# Patient Record
Sex: Male | Born: 1950
Health system: Southern US, Community
[De-identification: ages and names within clinical notes are randomized; demographics above are authoritative.]

---

## 2016-03-13 DIAGNOSIS — I10 Essential (primary) hypertension: Secondary | ICD-10-CM | POA: Diagnosis not present

## 2016-03-22 DIAGNOSIS — I10 Essential (primary) hypertension: Secondary | ICD-10-CM | POA: Diagnosis not present

## 2016-04-02 DIAGNOSIS — G4700 Insomnia, unspecified: Secondary | ICD-10-CM | POA: Diagnosis not present

## 2016-04-04 DIAGNOSIS — B351 Tinea unguium: Secondary | ICD-10-CM | POA: Diagnosis not present

## 2016-04-04 DIAGNOSIS — M79675 Pain in left toe(s): Secondary | ICD-10-CM | POA: Diagnosis not present

## 2016-04-04 DIAGNOSIS — E1140 Type 2 diabetes mellitus with diabetic neuropathy, unspecified: Secondary | ICD-10-CM | POA: Diagnosis not present

## 2016-04-11 DIAGNOSIS — I82423 Acute embolism and thrombosis of iliac vein, bilateral: Secondary | ICD-10-CM | POA: Diagnosis not present

## 2016-04-11 DIAGNOSIS — R600 Localized edema: Secondary | ICD-10-CM | POA: Diagnosis not present

## 2016-04-11 DIAGNOSIS — I10 Essential (primary) hypertension: Secondary | ICD-10-CM | POA: Diagnosis not present

## 2016-04-11 DIAGNOSIS — I69320 Aphasia following cerebral infarction: Secondary | ICD-10-CM | POA: Diagnosis not present

## 2016-04-19 DIAGNOSIS — I10 Essential (primary) hypertension: Secondary | ICD-10-CM | POA: Diagnosis not present

## 2016-04-30 DIAGNOSIS — G4700 Insomnia, unspecified: Secondary | ICD-10-CM | POA: Diagnosis not present

## 2016-05-17 DIAGNOSIS — I10 Essential (primary) hypertension: Secondary | ICD-10-CM | POA: Diagnosis not present

## 2016-05-23 DIAGNOSIS — R600 Localized edema: Secondary | ICD-10-CM | POA: Diagnosis not present

## 2016-05-23 DIAGNOSIS — I69320 Aphasia following cerebral infarction: Secondary | ICD-10-CM | POA: Diagnosis not present

## 2016-05-23 DIAGNOSIS — G8191 Hemiplegia, unspecified affecting right dominant side: Secondary | ICD-10-CM | POA: Diagnosis not present

## 2016-05-23 DIAGNOSIS — I10 Essential (primary) hypertension: Secondary | ICD-10-CM | POA: Diagnosis not present

## 2016-06-22 DIAGNOSIS — M1990 Unspecified osteoarthritis, unspecified site: Secondary | ICD-10-CM | POA: Diagnosis not present

## 2016-06-22 DIAGNOSIS — E785 Hyperlipidemia, unspecified: Secondary | ICD-10-CM | POA: Diagnosis not present

## 2016-06-22 DIAGNOSIS — E1140 Type 2 diabetes mellitus with diabetic neuropathy, unspecified: Secondary | ICD-10-CM | POA: Diagnosis not present

## 2016-06-27 DIAGNOSIS — I890 Lymphedema, not elsewhere classified: Secondary | ICD-10-CM | POA: Diagnosis not present

## 2016-06-27 DIAGNOSIS — E1140 Type 2 diabetes mellitus with diabetic neuropathy, unspecified: Secondary | ICD-10-CM | POA: Diagnosis not present

## 2016-06-27 DIAGNOSIS — H54415A Blindness right eye category 5, normal vision left eye: Secondary | ICD-10-CM | POA: Diagnosis not present

## 2016-06-27 DIAGNOSIS — Z993 Dependence on wheelchair: Secondary | ICD-10-CM | POA: Diagnosis not present

## 2016-06-27 DIAGNOSIS — I69390 Apraxia following cerebral infarction: Secondary | ICD-10-CM | POA: Diagnosis not present

## 2016-06-27 DIAGNOSIS — I10 Essential (primary) hypertension: Secondary | ICD-10-CM | POA: Diagnosis not present

## 2016-06-27 DIAGNOSIS — I69351 Hemiplegia and hemiparesis following cerebral infarction affecting right dominant side: Secondary | ICD-10-CM | POA: Diagnosis not present

## 2016-06-27 DIAGNOSIS — I69320 Aphasia following cerebral infarction: Secondary | ICD-10-CM | POA: Diagnosis not present

## 2016-06-28 DIAGNOSIS — E1140 Type 2 diabetes mellitus with diabetic neuropathy, unspecified: Secondary | ICD-10-CM | POA: Diagnosis not present

## 2016-06-28 DIAGNOSIS — I69390 Apraxia following cerebral infarction: Secondary | ICD-10-CM | POA: Diagnosis not present

## 2016-06-28 DIAGNOSIS — I890 Lymphedema, not elsewhere classified: Secondary | ICD-10-CM | POA: Diagnosis not present

## 2016-06-28 DIAGNOSIS — I10 Essential (primary) hypertension: Secondary | ICD-10-CM | POA: Diagnosis not present

## 2016-06-28 DIAGNOSIS — I69320 Aphasia following cerebral infarction: Secondary | ICD-10-CM | POA: Diagnosis not present

## 2016-06-28 DIAGNOSIS — I69351 Hemiplegia and hemiparesis following cerebral infarction affecting right dominant side: Secondary | ICD-10-CM | POA: Diagnosis not present

## 2016-06-29 DIAGNOSIS — I10 Essential (primary) hypertension: Secondary | ICD-10-CM | POA: Diagnosis not present

## 2016-06-29 DIAGNOSIS — L989 Disorder of the skin and subcutaneous tissue, unspecified: Secondary | ICD-10-CM | POA: Diagnosis not present

## 2016-06-29 DIAGNOSIS — I69359 Hemiplegia and hemiparesis following cerebral infarction affecting unspecified side: Secondary | ICD-10-CM | POA: Diagnosis not present

## 2016-06-29 DIAGNOSIS — F5101 Primary insomnia: Secondary | ICD-10-CM | POA: Diagnosis not present

## 2016-06-29 DIAGNOSIS — I639 Cerebral infarction, unspecified: Secondary | ICD-10-CM | POA: Diagnosis not present

## 2016-06-29 DIAGNOSIS — E782 Mixed hyperlipidemia: Secondary | ICD-10-CM | POA: Diagnosis not present

## 2016-06-29 DIAGNOSIS — I890 Lymphedema, not elsewhere classified: Secondary | ICD-10-CM | POA: Diagnosis not present

## 2016-06-29 DIAGNOSIS — K5900 Constipation, unspecified: Secondary | ICD-10-CM | POA: Diagnosis not present

## 2016-07-03 DIAGNOSIS — I890 Lymphedema, not elsewhere classified: Secondary | ICD-10-CM | POA: Diagnosis not present

## 2016-07-03 DIAGNOSIS — E1140 Type 2 diabetes mellitus with diabetic neuropathy, unspecified: Secondary | ICD-10-CM | POA: Diagnosis not present

## 2016-07-03 DIAGNOSIS — I69351 Hemiplegia and hemiparesis following cerebral infarction affecting right dominant side: Secondary | ICD-10-CM | POA: Diagnosis not present

## 2016-07-03 DIAGNOSIS — I69320 Aphasia following cerebral infarction: Secondary | ICD-10-CM | POA: Diagnosis not present

## 2016-07-03 DIAGNOSIS — I69390 Apraxia following cerebral infarction: Secondary | ICD-10-CM | POA: Diagnosis not present

## 2016-07-03 DIAGNOSIS — I10 Essential (primary) hypertension: Secondary | ICD-10-CM | POA: Diagnosis not present

## 2016-07-04 DIAGNOSIS — I890 Lymphedema, not elsewhere classified: Secondary | ICD-10-CM | POA: Diagnosis not present

## 2016-07-04 DIAGNOSIS — I69320 Aphasia following cerebral infarction: Secondary | ICD-10-CM | POA: Diagnosis not present

## 2016-07-04 DIAGNOSIS — I10 Essential (primary) hypertension: Secondary | ICD-10-CM | POA: Diagnosis not present

## 2016-07-04 DIAGNOSIS — E1140 Type 2 diabetes mellitus with diabetic neuropathy, unspecified: Secondary | ICD-10-CM | POA: Diagnosis not present

## 2016-07-04 DIAGNOSIS — I69390 Apraxia following cerebral infarction: Secondary | ICD-10-CM | POA: Diagnosis not present

## 2016-07-04 DIAGNOSIS — I69351 Hemiplegia and hemiparesis following cerebral infarction affecting right dominant side: Secondary | ICD-10-CM | POA: Diagnosis not present

## 2016-07-05 DIAGNOSIS — I69320 Aphasia following cerebral infarction: Secondary | ICD-10-CM | POA: Diagnosis not present

## 2016-07-05 DIAGNOSIS — I890 Lymphedema, not elsewhere classified: Secondary | ICD-10-CM | POA: Diagnosis not present

## 2016-07-05 DIAGNOSIS — E1140 Type 2 diabetes mellitus with diabetic neuropathy, unspecified: Secondary | ICD-10-CM | POA: Diagnosis not present

## 2016-07-05 DIAGNOSIS — I10 Essential (primary) hypertension: Secondary | ICD-10-CM | POA: Diagnosis not present

## 2016-07-05 DIAGNOSIS — I69351 Hemiplegia and hemiparesis following cerebral infarction affecting right dominant side: Secondary | ICD-10-CM | POA: Diagnosis not present

## 2016-07-05 DIAGNOSIS — I69390 Apraxia following cerebral infarction: Secondary | ICD-10-CM | POA: Diagnosis not present

## 2016-07-09 DIAGNOSIS — I10 Essential (primary) hypertension: Secondary | ICD-10-CM | POA: Diagnosis not present

## 2016-07-09 DIAGNOSIS — I69390 Apraxia following cerebral infarction: Secondary | ICD-10-CM | POA: Diagnosis not present

## 2016-07-09 DIAGNOSIS — I890 Lymphedema, not elsewhere classified: Secondary | ICD-10-CM | POA: Diagnosis not present

## 2016-07-09 DIAGNOSIS — I69351 Hemiplegia and hemiparesis following cerebral infarction affecting right dominant side: Secondary | ICD-10-CM | POA: Diagnosis not present

## 2016-07-09 DIAGNOSIS — E1140 Type 2 diabetes mellitus with diabetic neuropathy, unspecified: Secondary | ICD-10-CM | POA: Diagnosis not present

## 2016-07-09 DIAGNOSIS — I69320 Aphasia following cerebral infarction: Secondary | ICD-10-CM | POA: Diagnosis not present

## 2016-07-10 DIAGNOSIS — E1140 Type 2 diabetes mellitus with diabetic neuropathy, unspecified: Secondary | ICD-10-CM | POA: Diagnosis not present

## 2016-07-10 DIAGNOSIS — I69351 Hemiplegia and hemiparesis following cerebral infarction affecting right dominant side: Secondary | ICD-10-CM | POA: Diagnosis not present

## 2016-07-10 DIAGNOSIS — I69390 Apraxia following cerebral infarction: Secondary | ICD-10-CM | POA: Diagnosis not present

## 2016-07-10 DIAGNOSIS — I69320 Aphasia following cerebral infarction: Secondary | ICD-10-CM | POA: Diagnosis not present

## 2016-07-10 DIAGNOSIS — I10 Essential (primary) hypertension: Secondary | ICD-10-CM | POA: Diagnosis not present

## 2016-07-10 DIAGNOSIS — I890 Lymphedema, not elsewhere classified: Secondary | ICD-10-CM | POA: Diagnosis not present

## 2016-07-11 DIAGNOSIS — I69351 Hemiplegia and hemiparesis following cerebral infarction affecting right dominant side: Secondary | ICD-10-CM | POA: Diagnosis not present

## 2016-07-11 DIAGNOSIS — I890 Lymphedema, not elsewhere classified: Secondary | ICD-10-CM | POA: Diagnosis not present

## 2016-07-11 DIAGNOSIS — I69320 Aphasia following cerebral infarction: Secondary | ICD-10-CM | POA: Diagnosis not present

## 2016-07-11 DIAGNOSIS — I69390 Apraxia following cerebral infarction: Secondary | ICD-10-CM | POA: Diagnosis not present

## 2016-07-11 DIAGNOSIS — E1140 Type 2 diabetes mellitus with diabetic neuropathy, unspecified: Secondary | ICD-10-CM | POA: Diagnosis not present

## 2016-07-11 DIAGNOSIS — I10 Essential (primary) hypertension: Secondary | ICD-10-CM | POA: Diagnosis not present

## 2016-07-12 DIAGNOSIS — I69390 Apraxia following cerebral infarction: Secondary | ICD-10-CM | POA: Diagnosis not present

## 2016-07-12 DIAGNOSIS — I10 Essential (primary) hypertension: Secondary | ICD-10-CM | POA: Diagnosis not present

## 2016-07-12 DIAGNOSIS — E1140 Type 2 diabetes mellitus with diabetic neuropathy, unspecified: Secondary | ICD-10-CM | POA: Diagnosis not present

## 2016-07-12 DIAGNOSIS — I69351 Hemiplegia and hemiparesis following cerebral infarction affecting right dominant side: Secondary | ICD-10-CM | POA: Diagnosis not present

## 2016-07-12 DIAGNOSIS — I890 Lymphedema, not elsewhere classified: Secondary | ICD-10-CM | POA: Diagnosis not present

## 2016-07-12 DIAGNOSIS — I69320 Aphasia following cerebral infarction: Secondary | ICD-10-CM | POA: Diagnosis not present

## 2016-07-13 DIAGNOSIS — I69390 Apraxia following cerebral infarction: Secondary | ICD-10-CM | POA: Diagnosis not present

## 2016-07-13 DIAGNOSIS — I10 Essential (primary) hypertension: Secondary | ICD-10-CM | POA: Diagnosis not present

## 2016-07-13 DIAGNOSIS — I890 Lymphedema, not elsewhere classified: Secondary | ICD-10-CM | POA: Diagnosis not present

## 2016-07-13 DIAGNOSIS — I69351 Hemiplegia and hemiparesis following cerebral infarction affecting right dominant side: Secondary | ICD-10-CM | POA: Diagnosis not present

## 2016-07-13 DIAGNOSIS — I69320 Aphasia following cerebral infarction: Secondary | ICD-10-CM | POA: Diagnosis not present

## 2016-07-13 DIAGNOSIS — E1140 Type 2 diabetes mellitus with diabetic neuropathy, unspecified: Secondary | ICD-10-CM | POA: Diagnosis not present

## 2016-07-16 DIAGNOSIS — I69351 Hemiplegia and hemiparesis following cerebral infarction affecting right dominant side: Secondary | ICD-10-CM | POA: Diagnosis not present

## 2016-07-16 DIAGNOSIS — I69390 Apraxia following cerebral infarction: Secondary | ICD-10-CM | POA: Diagnosis not present

## 2016-07-16 DIAGNOSIS — I890 Lymphedema, not elsewhere classified: Secondary | ICD-10-CM | POA: Diagnosis not present

## 2016-07-16 DIAGNOSIS — I10 Essential (primary) hypertension: Secondary | ICD-10-CM | POA: Diagnosis not present

## 2016-07-16 DIAGNOSIS — I69320 Aphasia following cerebral infarction: Secondary | ICD-10-CM | POA: Diagnosis not present

## 2016-07-16 DIAGNOSIS — E1140 Type 2 diabetes mellitus with diabetic neuropathy, unspecified: Secondary | ICD-10-CM | POA: Diagnosis not present

## 2016-07-17 DIAGNOSIS — I69320 Aphasia following cerebral infarction: Secondary | ICD-10-CM | POA: Diagnosis not present

## 2016-07-17 DIAGNOSIS — I69390 Apraxia following cerebral infarction: Secondary | ICD-10-CM | POA: Diagnosis not present

## 2016-07-17 DIAGNOSIS — E1140 Type 2 diabetes mellitus with diabetic neuropathy, unspecified: Secondary | ICD-10-CM | POA: Diagnosis not present

## 2016-07-17 DIAGNOSIS — I69351 Hemiplegia and hemiparesis following cerebral infarction affecting right dominant side: Secondary | ICD-10-CM | POA: Diagnosis not present

## 2016-07-17 DIAGNOSIS — I890 Lymphedema, not elsewhere classified: Secondary | ICD-10-CM | POA: Diagnosis not present

## 2016-07-17 DIAGNOSIS — I10 Essential (primary) hypertension: Secondary | ICD-10-CM | POA: Diagnosis not present

## 2016-07-18 DIAGNOSIS — E1140 Type 2 diabetes mellitus with diabetic neuropathy, unspecified: Secondary | ICD-10-CM | POA: Diagnosis not present

## 2016-07-18 DIAGNOSIS — I69320 Aphasia following cerebral infarction: Secondary | ICD-10-CM | POA: Diagnosis not present

## 2016-07-18 DIAGNOSIS — I10 Essential (primary) hypertension: Secondary | ICD-10-CM | POA: Diagnosis not present

## 2016-07-18 DIAGNOSIS — I890 Lymphedema, not elsewhere classified: Secondary | ICD-10-CM | POA: Diagnosis not present

## 2016-07-18 DIAGNOSIS — I69390 Apraxia following cerebral infarction: Secondary | ICD-10-CM | POA: Diagnosis not present

## 2016-07-18 DIAGNOSIS — I69351 Hemiplegia and hemiparesis following cerebral infarction affecting right dominant side: Secondary | ICD-10-CM | POA: Diagnosis not present

## 2016-07-19 DIAGNOSIS — I69320 Aphasia following cerebral infarction: Secondary | ICD-10-CM | POA: Diagnosis not present

## 2016-07-19 DIAGNOSIS — I890 Lymphedema, not elsewhere classified: Secondary | ICD-10-CM | POA: Diagnosis not present

## 2016-07-19 DIAGNOSIS — I10 Essential (primary) hypertension: Secondary | ICD-10-CM | POA: Diagnosis not present

## 2016-07-19 DIAGNOSIS — I69390 Apraxia following cerebral infarction: Secondary | ICD-10-CM | POA: Diagnosis not present

## 2016-07-19 DIAGNOSIS — E1140 Type 2 diabetes mellitus with diabetic neuropathy, unspecified: Secondary | ICD-10-CM | POA: Diagnosis not present

## 2016-07-19 DIAGNOSIS — I69351 Hemiplegia and hemiparesis following cerebral infarction affecting right dominant side: Secondary | ICD-10-CM | POA: Diagnosis not present

## 2016-07-23 DIAGNOSIS — I69390 Apraxia following cerebral infarction: Secondary | ICD-10-CM | POA: Diagnosis not present

## 2016-07-23 DIAGNOSIS — I890 Lymphedema, not elsewhere classified: Secondary | ICD-10-CM | POA: Diagnosis not present

## 2016-07-23 DIAGNOSIS — I10 Essential (primary) hypertension: Secondary | ICD-10-CM | POA: Diagnosis not present

## 2016-07-23 DIAGNOSIS — I69351 Hemiplegia and hemiparesis following cerebral infarction affecting right dominant side: Secondary | ICD-10-CM | POA: Diagnosis not present

## 2016-07-23 DIAGNOSIS — E1140 Type 2 diabetes mellitus with diabetic neuropathy, unspecified: Secondary | ICD-10-CM | POA: Diagnosis not present

## 2016-07-23 DIAGNOSIS — I69320 Aphasia following cerebral infarction: Secondary | ICD-10-CM | POA: Diagnosis not present

## 2016-07-24 DIAGNOSIS — I69351 Hemiplegia and hemiparesis following cerebral infarction affecting right dominant side: Secondary | ICD-10-CM | POA: Diagnosis not present

## 2016-07-24 DIAGNOSIS — E1140 Type 2 diabetes mellitus with diabetic neuropathy, unspecified: Secondary | ICD-10-CM | POA: Diagnosis not present

## 2016-07-24 DIAGNOSIS — I69320 Aphasia following cerebral infarction: Secondary | ICD-10-CM | POA: Diagnosis not present

## 2016-07-24 DIAGNOSIS — I10 Essential (primary) hypertension: Secondary | ICD-10-CM | POA: Diagnosis not present

## 2016-07-24 DIAGNOSIS — I890 Lymphedema, not elsewhere classified: Secondary | ICD-10-CM | POA: Diagnosis not present

## 2016-07-24 DIAGNOSIS — I69390 Apraxia following cerebral infarction: Secondary | ICD-10-CM | POA: Diagnosis not present

## 2016-07-26 DIAGNOSIS — I890 Lymphedema, not elsewhere classified: Secondary | ICD-10-CM | POA: Diagnosis not present

## 2016-07-26 DIAGNOSIS — E1140 Type 2 diabetes mellitus with diabetic neuropathy, unspecified: Secondary | ICD-10-CM | POA: Diagnosis not present

## 2016-07-26 DIAGNOSIS — I69320 Aphasia following cerebral infarction: Secondary | ICD-10-CM | POA: Diagnosis not present

## 2016-07-26 DIAGNOSIS — I69351 Hemiplegia and hemiparesis following cerebral infarction affecting right dominant side: Secondary | ICD-10-CM | POA: Diagnosis not present

## 2016-07-26 DIAGNOSIS — I10 Essential (primary) hypertension: Secondary | ICD-10-CM | POA: Diagnosis not present

## 2016-07-26 DIAGNOSIS — I69390 Apraxia following cerebral infarction: Secondary | ICD-10-CM | POA: Diagnosis not present

## 2016-07-27 DIAGNOSIS — I69351 Hemiplegia and hemiparesis following cerebral infarction affecting right dominant side: Secondary | ICD-10-CM | POA: Diagnosis not present

## 2016-07-27 DIAGNOSIS — I69320 Aphasia following cerebral infarction: Secondary | ICD-10-CM | POA: Diagnosis not present

## 2016-07-27 DIAGNOSIS — I10 Essential (primary) hypertension: Secondary | ICD-10-CM | POA: Diagnosis not present

## 2016-07-27 DIAGNOSIS — E1140 Type 2 diabetes mellitus with diabetic neuropathy, unspecified: Secondary | ICD-10-CM | POA: Diagnosis not present

## 2016-07-27 DIAGNOSIS — I69390 Apraxia following cerebral infarction: Secondary | ICD-10-CM | POA: Diagnosis not present

## 2016-07-27 DIAGNOSIS — I890 Lymphedema, not elsewhere classified: Secondary | ICD-10-CM | POA: Diagnosis not present

## 2016-07-30 DIAGNOSIS — I890 Lymphedema, not elsewhere classified: Secondary | ICD-10-CM | POA: Diagnosis not present

## 2016-07-30 DIAGNOSIS — I69320 Aphasia following cerebral infarction: Secondary | ICD-10-CM | POA: Diagnosis not present

## 2016-07-30 DIAGNOSIS — E1140 Type 2 diabetes mellitus with diabetic neuropathy, unspecified: Secondary | ICD-10-CM | POA: Diagnosis not present

## 2016-07-30 DIAGNOSIS — I10 Essential (primary) hypertension: Secondary | ICD-10-CM | POA: Diagnosis not present

## 2016-07-30 DIAGNOSIS — I69351 Hemiplegia and hemiparesis following cerebral infarction affecting right dominant side: Secondary | ICD-10-CM | POA: Diagnosis not present

## 2016-07-30 DIAGNOSIS — I69390 Apraxia following cerebral infarction: Secondary | ICD-10-CM | POA: Diagnosis not present

## 2016-07-31 DIAGNOSIS — I10 Essential (primary) hypertension: Secondary | ICD-10-CM | POA: Diagnosis not present

## 2016-07-31 DIAGNOSIS — I69390 Apraxia following cerebral infarction: Secondary | ICD-10-CM | POA: Diagnosis not present

## 2016-07-31 DIAGNOSIS — I69320 Aphasia following cerebral infarction: Secondary | ICD-10-CM | POA: Diagnosis not present

## 2016-07-31 DIAGNOSIS — I890 Lymphedema, not elsewhere classified: Secondary | ICD-10-CM | POA: Diagnosis not present

## 2016-07-31 DIAGNOSIS — E1140 Type 2 diabetes mellitus with diabetic neuropathy, unspecified: Secondary | ICD-10-CM | POA: Diagnosis not present

## 2016-07-31 DIAGNOSIS — I69351 Hemiplegia and hemiparesis following cerebral infarction affecting right dominant side: Secondary | ICD-10-CM | POA: Diagnosis not present

## 2016-08-01 DIAGNOSIS — I69320 Aphasia following cerebral infarction: Secondary | ICD-10-CM | POA: Diagnosis not present

## 2016-08-01 DIAGNOSIS — I69390 Apraxia following cerebral infarction: Secondary | ICD-10-CM | POA: Diagnosis not present

## 2016-08-01 DIAGNOSIS — I890 Lymphedema, not elsewhere classified: Secondary | ICD-10-CM | POA: Diagnosis not present

## 2016-08-01 DIAGNOSIS — I69351 Hemiplegia and hemiparesis following cerebral infarction affecting right dominant side: Secondary | ICD-10-CM | POA: Diagnosis not present

## 2016-08-01 DIAGNOSIS — I10 Essential (primary) hypertension: Secondary | ICD-10-CM | POA: Diagnosis not present

## 2016-08-01 DIAGNOSIS — E1140 Type 2 diabetes mellitus with diabetic neuropathy, unspecified: Secondary | ICD-10-CM | POA: Diagnosis not present

## 2016-08-02 DIAGNOSIS — E1140 Type 2 diabetes mellitus with diabetic neuropathy, unspecified: Secondary | ICD-10-CM | POA: Diagnosis not present

## 2016-08-02 DIAGNOSIS — I890 Lymphedema, not elsewhere classified: Secondary | ICD-10-CM | POA: Diagnosis not present

## 2016-08-02 DIAGNOSIS — I10 Essential (primary) hypertension: Secondary | ICD-10-CM | POA: Diagnosis not present

## 2016-08-02 DIAGNOSIS — I69390 Apraxia following cerebral infarction: Secondary | ICD-10-CM | POA: Diagnosis not present

## 2016-08-02 DIAGNOSIS — I69320 Aphasia following cerebral infarction: Secondary | ICD-10-CM | POA: Diagnosis not present

## 2016-08-02 DIAGNOSIS — I69351 Hemiplegia and hemiparesis following cerebral infarction affecting right dominant side: Secondary | ICD-10-CM | POA: Diagnosis not present

## 2016-08-03 DIAGNOSIS — I69351 Hemiplegia and hemiparesis following cerebral infarction affecting right dominant side: Secondary | ICD-10-CM | POA: Diagnosis not present

## 2016-08-03 DIAGNOSIS — I69320 Aphasia following cerebral infarction: Secondary | ICD-10-CM | POA: Diagnosis not present

## 2016-08-03 DIAGNOSIS — I890 Lymphedema, not elsewhere classified: Secondary | ICD-10-CM | POA: Diagnosis not present

## 2016-08-03 DIAGNOSIS — I10 Essential (primary) hypertension: Secondary | ICD-10-CM | POA: Diagnosis not present

## 2016-08-03 DIAGNOSIS — I69390 Apraxia following cerebral infarction: Secondary | ICD-10-CM | POA: Diagnosis not present

## 2016-08-03 DIAGNOSIS — E1140 Type 2 diabetes mellitus with diabetic neuropathy, unspecified: Secondary | ICD-10-CM | POA: Diagnosis not present

## 2016-08-07 DIAGNOSIS — I69320 Aphasia following cerebral infarction: Secondary | ICD-10-CM | POA: Diagnosis not present

## 2016-08-07 DIAGNOSIS — I69351 Hemiplegia and hemiparesis following cerebral infarction affecting right dominant side: Secondary | ICD-10-CM | POA: Diagnosis not present

## 2016-08-07 DIAGNOSIS — I890 Lymphedema, not elsewhere classified: Secondary | ICD-10-CM | POA: Diagnosis not present

## 2016-08-07 DIAGNOSIS — I69390 Apraxia following cerebral infarction: Secondary | ICD-10-CM | POA: Diagnosis not present

## 2016-08-07 DIAGNOSIS — E1140 Type 2 diabetes mellitus with diabetic neuropathy, unspecified: Secondary | ICD-10-CM | POA: Diagnosis not present

## 2016-08-07 DIAGNOSIS — I10 Essential (primary) hypertension: Secondary | ICD-10-CM | POA: Diagnosis not present

## 2016-08-08 DIAGNOSIS — I69320 Aphasia following cerebral infarction: Secondary | ICD-10-CM | POA: Diagnosis not present

## 2016-08-08 DIAGNOSIS — I10 Essential (primary) hypertension: Secondary | ICD-10-CM | POA: Diagnosis not present

## 2016-08-08 DIAGNOSIS — I69390 Apraxia following cerebral infarction: Secondary | ICD-10-CM | POA: Diagnosis not present

## 2016-08-08 DIAGNOSIS — E1140 Type 2 diabetes mellitus with diabetic neuropathy, unspecified: Secondary | ICD-10-CM | POA: Diagnosis not present

## 2016-08-08 DIAGNOSIS — I69351 Hemiplegia and hemiparesis following cerebral infarction affecting right dominant side: Secondary | ICD-10-CM | POA: Diagnosis not present

## 2016-08-08 DIAGNOSIS — I890 Lymphedema, not elsewhere classified: Secondary | ICD-10-CM | POA: Diagnosis not present

## 2016-08-09 DIAGNOSIS — I69390 Apraxia following cerebral infarction: Secondary | ICD-10-CM | POA: Diagnosis not present

## 2016-08-09 DIAGNOSIS — I890 Lymphedema, not elsewhere classified: Secondary | ICD-10-CM | POA: Diagnosis not present

## 2016-08-09 DIAGNOSIS — I69351 Hemiplegia and hemiparesis following cerebral infarction affecting right dominant side: Secondary | ICD-10-CM | POA: Diagnosis not present

## 2016-08-09 DIAGNOSIS — I10 Essential (primary) hypertension: Secondary | ICD-10-CM | POA: Diagnosis not present

## 2016-08-09 DIAGNOSIS — E1140 Type 2 diabetes mellitus with diabetic neuropathy, unspecified: Secondary | ICD-10-CM | POA: Diagnosis not present

## 2016-08-09 DIAGNOSIS — I69320 Aphasia following cerebral infarction: Secondary | ICD-10-CM | POA: Diagnosis not present

## 2016-08-13 DIAGNOSIS — I69320 Aphasia following cerebral infarction: Secondary | ICD-10-CM | POA: Diagnosis not present

## 2016-08-13 DIAGNOSIS — I69351 Hemiplegia and hemiparesis following cerebral infarction affecting right dominant side: Secondary | ICD-10-CM | POA: Diagnosis not present

## 2016-08-13 DIAGNOSIS — I890 Lymphedema, not elsewhere classified: Secondary | ICD-10-CM | POA: Diagnosis not present

## 2016-08-13 DIAGNOSIS — E1140 Type 2 diabetes mellitus with diabetic neuropathy, unspecified: Secondary | ICD-10-CM | POA: Diagnosis not present

## 2016-08-13 DIAGNOSIS — I69390 Apraxia following cerebral infarction: Secondary | ICD-10-CM | POA: Diagnosis not present

## 2016-08-13 DIAGNOSIS — I10 Essential (primary) hypertension: Secondary | ICD-10-CM | POA: Diagnosis not present

## 2016-08-14 DIAGNOSIS — I69320 Aphasia following cerebral infarction: Secondary | ICD-10-CM | POA: Diagnosis not present

## 2016-08-14 DIAGNOSIS — E1140 Type 2 diabetes mellitus with diabetic neuropathy, unspecified: Secondary | ICD-10-CM | POA: Diagnosis not present

## 2016-08-14 DIAGNOSIS — I69351 Hemiplegia and hemiparesis following cerebral infarction affecting right dominant side: Secondary | ICD-10-CM | POA: Diagnosis not present

## 2016-08-14 DIAGNOSIS — I10 Essential (primary) hypertension: Secondary | ICD-10-CM | POA: Diagnosis not present

## 2016-08-14 DIAGNOSIS — I69390 Apraxia following cerebral infarction: Secondary | ICD-10-CM | POA: Diagnosis not present

## 2016-08-14 DIAGNOSIS — I890 Lymphedema, not elsewhere classified: Secondary | ICD-10-CM | POA: Diagnosis not present

## 2016-08-16 DIAGNOSIS — I69351 Hemiplegia and hemiparesis following cerebral infarction affecting right dominant side: Secondary | ICD-10-CM | POA: Diagnosis not present

## 2016-08-16 DIAGNOSIS — I69390 Apraxia following cerebral infarction: Secondary | ICD-10-CM | POA: Diagnosis not present

## 2016-08-16 DIAGNOSIS — I890 Lymphedema, not elsewhere classified: Secondary | ICD-10-CM | POA: Diagnosis not present

## 2016-08-16 DIAGNOSIS — E1140 Type 2 diabetes mellitus with diabetic neuropathy, unspecified: Secondary | ICD-10-CM | POA: Diagnosis not present

## 2016-08-16 DIAGNOSIS — I10 Essential (primary) hypertension: Secondary | ICD-10-CM | POA: Diagnosis not present

## 2016-08-16 DIAGNOSIS — I69320 Aphasia following cerebral infarction: Secondary | ICD-10-CM | POA: Diagnosis not present

## 2016-08-17 DIAGNOSIS — I69390 Apraxia following cerebral infarction: Secondary | ICD-10-CM | POA: Diagnosis not present

## 2016-08-17 DIAGNOSIS — I69320 Aphasia following cerebral infarction: Secondary | ICD-10-CM | POA: Diagnosis not present

## 2016-08-17 DIAGNOSIS — E1140 Type 2 diabetes mellitus with diabetic neuropathy, unspecified: Secondary | ICD-10-CM | POA: Diagnosis not present

## 2016-08-17 DIAGNOSIS — I69351 Hemiplegia and hemiparesis following cerebral infarction affecting right dominant side: Secondary | ICD-10-CM | POA: Diagnosis not present

## 2016-08-17 DIAGNOSIS — I10 Essential (primary) hypertension: Secondary | ICD-10-CM | POA: Diagnosis not present

## 2016-08-17 DIAGNOSIS — I890 Lymphedema, not elsewhere classified: Secondary | ICD-10-CM | POA: Diagnosis not present

## 2016-08-23 DIAGNOSIS — I69390 Apraxia following cerebral infarction: Secondary | ICD-10-CM | POA: Diagnosis not present

## 2016-08-23 DIAGNOSIS — E1140 Type 2 diabetes mellitus with diabetic neuropathy, unspecified: Secondary | ICD-10-CM | POA: Diagnosis not present

## 2016-08-23 DIAGNOSIS — I69320 Aphasia following cerebral infarction: Secondary | ICD-10-CM | POA: Diagnosis not present

## 2016-08-23 DIAGNOSIS — I69351 Hemiplegia and hemiparesis following cerebral infarction affecting right dominant side: Secondary | ICD-10-CM | POA: Diagnosis not present

## 2016-08-23 DIAGNOSIS — I10 Essential (primary) hypertension: Secondary | ICD-10-CM | POA: Diagnosis not present

## 2016-08-23 DIAGNOSIS — I890 Lymphedema, not elsewhere classified: Secondary | ICD-10-CM | POA: Diagnosis not present

## 2016-08-26 DIAGNOSIS — I890 Lymphedema, not elsewhere classified: Secondary | ICD-10-CM | POA: Diagnosis not present

## 2016-08-26 DIAGNOSIS — S81802D Unspecified open wound, left lower leg, subsequent encounter: Secondary | ICD-10-CM | POA: Diagnosis not present

## 2016-08-26 DIAGNOSIS — I10 Essential (primary) hypertension: Secondary | ICD-10-CM | POA: Diagnosis not present

## 2016-08-26 DIAGNOSIS — S81801D Unspecified open wound, right lower leg, subsequent encounter: Secondary | ICD-10-CM | POA: Diagnosis not present

## 2016-08-26 DIAGNOSIS — I69320 Aphasia following cerebral infarction: Secondary | ICD-10-CM | POA: Diagnosis not present

## 2016-08-26 DIAGNOSIS — Z993 Dependence on wheelchair: Secondary | ICD-10-CM | POA: Diagnosis not present

## 2016-08-26 DIAGNOSIS — E1140 Type 2 diabetes mellitus with diabetic neuropathy, unspecified: Secondary | ICD-10-CM | POA: Diagnosis not present

## 2016-08-28 DIAGNOSIS — I69320 Aphasia following cerebral infarction: Secondary | ICD-10-CM | POA: Diagnosis not present

## 2016-08-28 DIAGNOSIS — E1140 Type 2 diabetes mellitus with diabetic neuropathy, unspecified: Secondary | ICD-10-CM | POA: Diagnosis not present

## 2016-08-28 DIAGNOSIS — I10 Essential (primary) hypertension: Secondary | ICD-10-CM | POA: Diagnosis not present

## 2016-08-28 DIAGNOSIS — S81802D Unspecified open wound, left lower leg, subsequent encounter: Secondary | ICD-10-CM | POA: Diagnosis not present

## 2016-08-28 DIAGNOSIS — I890 Lymphedema, not elsewhere classified: Secondary | ICD-10-CM | POA: Diagnosis not present

## 2016-08-28 DIAGNOSIS — S81801D Unspecified open wound, right lower leg, subsequent encounter: Secondary | ICD-10-CM | POA: Diagnosis not present

## 2016-09-04 DIAGNOSIS — I890 Lymphedema, not elsewhere classified: Secondary | ICD-10-CM | POA: Diagnosis not present

## 2016-09-04 DIAGNOSIS — I10 Essential (primary) hypertension: Secondary | ICD-10-CM | POA: Diagnosis not present

## 2016-09-04 DIAGNOSIS — S81801D Unspecified open wound, right lower leg, subsequent encounter: Secondary | ICD-10-CM | POA: Diagnosis not present

## 2016-09-04 DIAGNOSIS — I69320 Aphasia following cerebral infarction: Secondary | ICD-10-CM | POA: Diagnosis not present

## 2016-09-04 DIAGNOSIS — S81802D Unspecified open wound, left lower leg, subsequent encounter: Secondary | ICD-10-CM | POA: Diagnosis not present

## 2016-09-04 DIAGNOSIS — E1140 Type 2 diabetes mellitus with diabetic neuropathy, unspecified: Secondary | ICD-10-CM | POA: Diagnosis not present

## 2016-09-06 DIAGNOSIS — S81801D Unspecified open wound, right lower leg, subsequent encounter: Secondary | ICD-10-CM | POA: Diagnosis not present

## 2016-09-06 DIAGNOSIS — I10 Essential (primary) hypertension: Secondary | ICD-10-CM | POA: Diagnosis not present

## 2016-09-06 DIAGNOSIS — S81802D Unspecified open wound, left lower leg, subsequent encounter: Secondary | ICD-10-CM | POA: Diagnosis not present

## 2016-09-06 DIAGNOSIS — I890 Lymphedema, not elsewhere classified: Secondary | ICD-10-CM | POA: Diagnosis not present

## 2016-09-06 DIAGNOSIS — I69320 Aphasia following cerebral infarction: Secondary | ICD-10-CM | POA: Diagnosis not present

## 2016-09-06 DIAGNOSIS — E1140 Type 2 diabetes mellitus with diabetic neuropathy, unspecified: Secondary | ICD-10-CM | POA: Diagnosis not present

## 2016-09-11 DIAGNOSIS — E1140 Type 2 diabetes mellitus with diabetic neuropathy, unspecified: Secondary | ICD-10-CM | POA: Diagnosis not present

## 2016-09-11 DIAGNOSIS — S81801D Unspecified open wound, right lower leg, subsequent encounter: Secondary | ICD-10-CM | POA: Diagnosis not present

## 2016-09-11 DIAGNOSIS — I10 Essential (primary) hypertension: Secondary | ICD-10-CM | POA: Diagnosis not present

## 2016-09-11 DIAGNOSIS — S81802D Unspecified open wound, left lower leg, subsequent encounter: Secondary | ICD-10-CM | POA: Diagnosis not present

## 2016-09-11 DIAGNOSIS — I890 Lymphedema, not elsewhere classified: Secondary | ICD-10-CM | POA: Diagnosis not present

## 2016-09-11 DIAGNOSIS — I69320 Aphasia following cerebral infarction: Secondary | ICD-10-CM | POA: Diagnosis not present

## 2016-09-18 DIAGNOSIS — S81802D Unspecified open wound, left lower leg, subsequent encounter: Secondary | ICD-10-CM | POA: Diagnosis not present

## 2016-09-18 DIAGNOSIS — E1140 Type 2 diabetes mellitus with diabetic neuropathy, unspecified: Secondary | ICD-10-CM | POA: Diagnosis not present

## 2016-09-18 DIAGNOSIS — S81801D Unspecified open wound, right lower leg, subsequent encounter: Secondary | ICD-10-CM | POA: Diagnosis not present

## 2016-09-18 DIAGNOSIS — I890 Lymphedema, not elsewhere classified: Secondary | ICD-10-CM | POA: Diagnosis not present

## 2016-09-18 DIAGNOSIS — I10 Essential (primary) hypertension: Secondary | ICD-10-CM | POA: Diagnosis not present

## 2016-09-18 DIAGNOSIS — I69320 Aphasia following cerebral infarction: Secondary | ICD-10-CM | POA: Diagnosis not present

## 2016-09-20 DIAGNOSIS — S81802D Unspecified open wound, left lower leg, subsequent encounter: Secondary | ICD-10-CM | POA: Diagnosis not present

## 2016-09-20 DIAGNOSIS — I10 Essential (primary) hypertension: Secondary | ICD-10-CM | POA: Diagnosis not present

## 2016-09-20 DIAGNOSIS — I890 Lymphedema, not elsewhere classified: Secondary | ICD-10-CM | POA: Diagnosis not present

## 2016-09-20 DIAGNOSIS — E1140 Type 2 diabetes mellitus with diabetic neuropathy, unspecified: Secondary | ICD-10-CM | POA: Diagnosis not present

## 2016-09-20 DIAGNOSIS — S81801D Unspecified open wound, right lower leg, subsequent encounter: Secondary | ICD-10-CM | POA: Diagnosis not present

## 2016-09-20 DIAGNOSIS — I69320 Aphasia following cerebral infarction: Secondary | ICD-10-CM | POA: Diagnosis not present

## 2016-09-21 DIAGNOSIS — I890 Lymphedema, not elsewhere classified: Secondary | ICD-10-CM | POA: Diagnosis not present

## 2016-09-21 DIAGNOSIS — E1140 Type 2 diabetes mellitus with diabetic neuropathy, unspecified: Secondary | ICD-10-CM | POA: Diagnosis not present

## 2016-09-21 DIAGNOSIS — I10 Essential (primary) hypertension: Secondary | ICD-10-CM | POA: Diagnosis not present

## 2016-09-21 DIAGNOSIS — I69320 Aphasia following cerebral infarction: Secondary | ICD-10-CM | POA: Diagnosis not present

## 2016-09-21 DIAGNOSIS — S81801D Unspecified open wound, right lower leg, subsequent encounter: Secondary | ICD-10-CM | POA: Diagnosis not present

## 2016-09-21 DIAGNOSIS — S81802D Unspecified open wound, left lower leg, subsequent encounter: Secondary | ICD-10-CM | POA: Diagnosis not present

## 2016-09-25 DIAGNOSIS — S81801D Unspecified open wound, right lower leg, subsequent encounter: Secondary | ICD-10-CM | POA: Diagnosis not present

## 2016-09-25 DIAGNOSIS — I890 Lymphedema, not elsewhere classified: Secondary | ICD-10-CM | POA: Diagnosis not present

## 2016-09-25 DIAGNOSIS — E1140 Type 2 diabetes mellitus with diabetic neuropathy, unspecified: Secondary | ICD-10-CM | POA: Diagnosis not present

## 2016-09-25 DIAGNOSIS — I69320 Aphasia following cerebral infarction: Secondary | ICD-10-CM | POA: Diagnosis not present

## 2016-09-25 DIAGNOSIS — S81802D Unspecified open wound, left lower leg, subsequent encounter: Secondary | ICD-10-CM | POA: Diagnosis not present

## 2016-09-25 DIAGNOSIS — I10 Essential (primary) hypertension: Secondary | ICD-10-CM | POA: Diagnosis not present

## 2016-09-27 DIAGNOSIS — I10 Essential (primary) hypertension: Secondary | ICD-10-CM | POA: Diagnosis not present

## 2016-09-27 DIAGNOSIS — I890 Lymphedema, not elsewhere classified: Secondary | ICD-10-CM | POA: Diagnosis not present

## 2016-09-27 DIAGNOSIS — S81801D Unspecified open wound, right lower leg, subsequent encounter: Secondary | ICD-10-CM | POA: Diagnosis not present

## 2016-09-27 DIAGNOSIS — S81802D Unspecified open wound, left lower leg, subsequent encounter: Secondary | ICD-10-CM | POA: Diagnosis not present

## 2016-09-27 DIAGNOSIS — I69320 Aphasia following cerebral infarction: Secondary | ICD-10-CM | POA: Diagnosis not present

## 2016-09-27 DIAGNOSIS — E1140 Type 2 diabetes mellitus with diabetic neuropathy, unspecified: Secondary | ICD-10-CM | POA: Diagnosis not present

## 2016-10-02 DIAGNOSIS — S81801D Unspecified open wound, right lower leg, subsequent encounter: Secondary | ICD-10-CM | POA: Diagnosis not present

## 2016-10-02 DIAGNOSIS — I10 Essential (primary) hypertension: Secondary | ICD-10-CM | POA: Diagnosis not present

## 2016-10-02 DIAGNOSIS — S81802D Unspecified open wound, left lower leg, subsequent encounter: Secondary | ICD-10-CM | POA: Diagnosis not present

## 2016-10-02 DIAGNOSIS — I69320 Aphasia following cerebral infarction: Secondary | ICD-10-CM | POA: Diagnosis not present

## 2016-10-02 DIAGNOSIS — E1140 Type 2 diabetes mellitus with diabetic neuropathy, unspecified: Secondary | ICD-10-CM | POA: Diagnosis not present

## 2016-10-02 DIAGNOSIS — I890 Lymphedema, not elsewhere classified: Secondary | ICD-10-CM | POA: Diagnosis not present

## 2016-10-04 DIAGNOSIS — E1140 Type 2 diabetes mellitus with diabetic neuropathy, unspecified: Secondary | ICD-10-CM | POA: Diagnosis not present

## 2016-10-04 DIAGNOSIS — I10 Essential (primary) hypertension: Secondary | ICD-10-CM | POA: Diagnosis not present

## 2016-10-04 DIAGNOSIS — I890 Lymphedema, not elsewhere classified: Secondary | ICD-10-CM | POA: Diagnosis not present

## 2016-10-04 DIAGNOSIS — S81801D Unspecified open wound, right lower leg, subsequent encounter: Secondary | ICD-10-CM | POA: Diagnosis not present

## 2016-10-04 DIAGNOSIS — I69320 Aphasia following cerebral infarction: Secondary | ICD-10-CM | POA: Diagnosis not present

## 2016-10-04 DIAGNOSIS — S81802D Unspecified open wound, left lower leg, subsequent encounter: Secondary | ICD-10-CM | POA: Diagnosis not present

## 2016-10-05 DIAGNOSIS — I10 Essential (primary) hypertension: Secondary | ICD-10-CM | POA: Diagnosis not present

## 2016-10-05 DIAGNOSIS — E1140 Type 2 diabetes mellitus with diabetic neuropathy, unspecified: Secondary | ICD-10-CM | POA: Diagnosis not present

## 2016-10-05 DIAGNOSIS — I890 Lymphedema, not elsewhere classified: Secondary | ICD-10-CM | POA: Diagnosis not present

## 2016-10-05 DIAGNOSIS — S81801D Unspecified open wound, right lower leg, subsequent encounter: Secondary | ICD-10-CM | POA: Diagnosis not present

## 2016-10-05 DIAGNOSIS — S81802D Unspecified open wound, left lower leg, subsequent encounter: Secondary | ICD-10-CM | POA: Diagnosis not present

## 2016-10-05 DIAGNOSIS — I69320 Aphasia following cerebral infarction: Secondary | ICD-10-CM | POA: Diagnosis not present

## 2016-10-09 DIAGNOSIS — I890 Lymphedema, not elsewhere classified: Secondary | ICD-10-CM | POA: Diagnosis not present

## 2016-10-09 DIAGNOSIS — S81801D Unspecified open wound, right lower leg, subsequent encounter: Secondary | ICD-10-CM | POA: Diagnosis not present

## 2016-10-09 DIAGNOSIS — E1140 Type 2 diabetes mellitus with diabetic neuropathy, unspecified: Secondary | ICD-10-CM | POA: Diagnosis not present

## 2016-10-09 DIAGNOSIS — I10 Essential (primary) hypertension: Secondary | ICD-10-CM | POA: Diagnosis not present

## 2016-10-09 DIAGNOSIS — S81802D Unspecified open wound, left lower leg, subsequent encounter: Secondary | ICD-10-CM | POA: Diagnosis not present

## 2016-10-09 DIAGNOSIS — I69320 Aphasia following cerebral infarction: Secondary | ICD-10-CM | POA: Diagnosis not present

## 2016-10-16 DIAGNOSIS — S81801D Unspecified open wound, right lower leg, subsequent encounter: Secondary | ICD-10-CM | POA: Diagnosis not present

## 2016-10-16 DIAGNOSIS — S81802D Unspecified open wound, left lower leg, subsequent encounter: Secondary | ICD-10-CM | POA: Diagnosis not present

## 2016-10-16 DIAGNOSIS — I10 Essential (primary) hypertension: Secondary | ICD-10-CM | POA: Diagnosis not present

## 2016-10-16 DIAGNOSIS — I890 Lymphedema, not elsewhere classified: Secondary | ICD-10-CM | POA: Diagnosis not present

## 2016-10-16 DIAGNOSIS — E1140 Type 2 diabetes mellitus with diabetic neuropathy, unspecified: Secondary | ICD-10-CM | POA: Diagnosis not present

## 2016-10-16 DIAGNOSIS — I69320 Aphasia following cerebral infarction: Secondary | ICD-10-CM | POA: Diagnosis not present

## 2016-10-23 DIAGNOSIS — S81801D Unspecified open wound, right lower leg, subsequent encounter: Secondary | ICD-10-CM | POA: Diagnosis not present

## 2016-10-23 DIAGNOSIS — I10 Essential (primary) hypertension: Secondary | ICD-10-CM | POA: Diagnosis not present

## 2016-10-23 DIAGNOSIS — E1140 Type 2 diabetes mellitus with diabetic neuropathy, unspecified: Secondary | ICD-10-CM | POA: Diagnosis not present

## 2016-10-23 DIAGNOSIS — I890 Lymphedema, not elsewhere classified: Secondary | ICD-10-CM | POA: Diagnosis not present

## 2016-10-23 DIAGNOSIS — I69320 Aphasia following cerebral infarction: Secondary | ICD-10-CM | POA: Diagnosis not present

## 2016-10-23 DIAGNOSIS — S81802D Unspecified open wound, left lower leg, subsequent encounter: Secondary | ICD-10-CM | POA: Diagnosis not present

## 2016-10-25 DIAGNOSIS — Z7984 Long term (current) use of oral hypoglycemic drugs: Secondary | ICD-10-CM | POA: Diagnosis not present

## 2016-10-25 DIAGNOSIS — I69320 Aphasia following cerebral infarction: Secondary | ICD-10-CM | POA: Diagnosis not present

## 2016-10-25 DIAGNOSIS — I10 Essential (primary) hypertension: Secondary | ICD-10-CM | POA: Diagnosis not present

## 2016-10-25 DIAGNOSIS — E1140 Type 2 diabetes mellitus with diabetic neuropathy, unspecified: Secondary | ICD-10-CM | POA: Diagnosis not present

## 2016-10-25 DIAGNOSIS — I890 Lymphedema, not elsewhere classified: Secondary | ICD-10-CM | POA: Diagnosis not present

## 2016-10-25 DIAGNOSIS — Z993 Dependence on wheelchair: Secondary | ICD-10-CM | POA: Diagnosis not present

## 2016-10-25 DIAGNOSIS — Z7901 Long term (current) use of anticoagulants: Secondary | ICD-10-CM | POA: Diagnosis not present

## 2016-10-30 DIAGNOSIS — I10 Essential (primary) hypertension: Secondary | ICD-10-CM | POA: Diagnosis not present

## 2016-10-30 DIAGNOSIS — Z7984 Long term (current) use of oral hypoglycemic drugs: Secondary | ICD-10-CM | POA: Diagnosis not present

## 2016-10-30 DIAGNOSIS — Z993 Dependence on wheelchair: Secondary | ICD-10-CM | POA: Diagnosis not present

## 2016-10-30 DIAGNOSIS — E1140 Type 2 diabetes mellitus with diabetic neuropathy, unspecified: Secondary | ICD-10-CM | POA: Diagnosis not present

## 2016-10-30 DIAGNOSIS — I69320 Aphasia following cerebral infarction: Secondary | ICD-10-CM | POA: Diagnosis not present

## 2016-10-30 DIAGNOSIS — I890 Lymphedema, not elsewhere classified: Secondary | ICD-10-CM | POA: Diagnosis not present

## 2016-11-01 DIAGNOSIS — I890 Lymphedema, not elsewhere classified: Secondary | ICD-10-CM | POA: Diagnosis not present

## 2016-11-01 DIAGNOSIS — Z7984 Long term (current) use of oral hypoglycemic drugs: Secondary | ICD-10-CM | POA: Diagnosis not present

## 2016-11-01 DIAGNOSIS — Z993 Dependence on wheelchair: Secondary | ICD-10-CM | POA: Diagnosis not present

## 2016-11-01 DIAGNOSIS — E1140 Type 2 diabetes mellitus with diabetic neuropathy, unspecified: Secondary | ICD-10-CM | POA: Diagnosis not present

## 2016-11-01 DIAGNOSIS — I10 Essential (primary) hypertension: Secondary | ICD-10-CM | POA: Diagnosis not present

## 2016-11-01 DIAGNOSIS — I69320 Aphasia following cerebral infarction: Secondary | ICD-10-CM | POA: Diagnosis not present

## 2016-11-06 DIAGNOSIS — I890 Lymphedema, not elsewhere classified: Secondary | ICD-10-CM | POA: Diagnosis not present

## 2016-11-06 DIAGNOSIS — I69320 Aphasia following cerebral infarction: Secondary | ICD-10-CM | POA: Diagnosis not present

## 2016-11-06 DIAGNOSIS — I10 Essential (primary) hypertension: Secondary | ICD-10-CM | POA: Diagnosis not present

## 2016-11-06 DIAGNOSIS — E1140 Type 2 diabetes mellitus with diabetic neuropathy, unspecified: Secondary | ICD-10-CM | POA: Diagnosis not present

## 2016-11-06 DIAGNOSIS — Z993 Dependence on wheelchair: Secondary | ICD-10-CM | POA: Diagnosis not present

## 2016-11-06 DIAGNOSIS — Z7984 Long term (current) use of oral hypoglycemic drugs: Secondary | ICD-10-CM | POA: Diagnosis not present

## 2016-11-08 DIAGNOSIS — I69320 Aphasia following cerebral infarction: Secondary | ICD-10-CM | POA: Diagnosis not present

## 2016-11-08 DIAGNOSIS — I890 Lymphedema, not elsewhere classified: Secondary | ICD-10-CM | POA: Diagnosis not present

## 2016-11-08 DIAGNOSIS — Z7984 Long term (current) use of oral hypoglycemic drugs: Secondary | ICD-10-CM | POA: Diagnosis not present

## 2016-11-08 DIAGNOSIS — I10 Essential (primary) hypertension: Secondary | ICD-10-CM | POA: Diagnosis not present

## 2016-11-08 DIAGNOSIS — Z993 Dependence on wheelchair: Secondary | ICD-10-CM | POA: Diagnosis not present

## 2016-11-08 DIAGNOSIS — E1140 Type 2 diabetes mellitus with diabetic neuropathy, unspecified: Secondary | ICD-10-CM | POA: Diagnosis not present

## 2016-11-13 DIAGNOSIS — I10 Essential (primary) hypertension: Secondary | ICD-10-CM | POA: Diagnosis not present

## 2016-11-13 DIAGNOSIS — Z7984 Long term (current) use of oral hypoglycemic drugs: Secondary | ICD-10-CM | POA: Diagnosis not present

## 2016-11-13 DIAGNOSIS — I69320 Aphasia following cerebral infarction: Secondary | ICD-10-CM | POA: Diagnosis not present

## 2016-11-13 DIAGNOSIS — I890 Lymphedema, not elsewhere classified: Secondary | ICD-10-CM | POA: Diagnosis not present

## 2016-11-13 DIAGNOSIS — Z993 Dependence on wheelchair: Secondary | ICD-10-CM | POA: Diagnosis not present

## 2016-11-13 DIAGNOSIS — E1140 Type 2 diabetes mellitus with diabetic neuropathy, unspecified: Secondary | ICD-10-CM | POA: Diagnosis not present

## 2016-11-20 DIAGNOSIS — I10 Essential (primary) hypertension: Secondary | ICD-10-CM | POA: Diagnosis not present

## 2016-11-20 DIAGNOSIS — Z7984 Long term (current) use of oral hypoglycemic drugs: Secondary | ICD-10-CM | POA: Diagnosis not present

## 2016-11-20 DIAGNOSIS — Z993 Dependence on wheelchair: Secondary | ICD-10-CM | POA: Diagnosis not present

## 2016-11-20 DIAGNOSIS — E1140 Type 2 diabetes mellitus with diabetic neuropathy, unspecified: Secondary | ICD-10-CM | POA: Diagnosis not present

## 2016-11-20 DIAGNOSIS — I69320 Aphasia following cerebral infarction: Secondary | ICD-10-CM | POA: Diagnosis not present

## 2016-11-20 DIAGNOSIS — I890 Lymphedema, not elsewhere classified: Secondary | ICD-10-CM | POA: Diagnosis not present

## 2016-11-27 DIAGNOSIS — I10 Essential (primary) hypertension: Secondary | ICD-10-CM | POA: Diagnosis not present

## 2016-11-27 DIAGNOSIS — Z993 Dependence on wheelchair: Secondary | ICD-10-CM | POA: Diagnosis not present

## 2016-11-27 DIAGNOSIS — Z7984 Long term (current) use of oral hypoglycemic drugs: Secondary | ICD-10-CM | POA: Diagnosis not present

## 2016-11-27 DIAGNOSIS — I69320 Aphasia following cerebral infarction: Secondary | ICD-10-CM | POA: Diagnosis not present

## 2016-11-27 DIAGNOSIS — E1140 Type 2 diabetes mellitus with diabetic neuropathy, unspecified: Secondary | ICD-10-CM | POA: Diagnosis not present

## 2016-11-27 DIAGNOSIS — I890 Lymphedema, not elsewhere classified: Secondary | ICD-10-CM | POA: Diagnosis not present

## 2016-12-06 DIAGNOSIS — I69320 Aphasia following cerebral infarction: Secondary | ICD-10-CM | POA: Diagnosis not present

## 2016-12-06 DIAGNOSIS — Z7984 Long term (current) use of oral hypoglycemic drugs: Secondary | ICD-10-CM | POA: Diagnosis not present

## 2016-12-06 DIAGNOSIS — E1140 Type 2 diabetes mellitus with diabetic neuropathy, unspecified: Secondary | ICD-10-CM | POA: Diagnosis not present

## 2016-12-06 DIAGNOSIS — I890 Lymphedema, not elsewhere classified: Secondary | ICD-10-CM | POA: Diagnosis not present

## 2016-12-06 DIAGNOSIS — I10 Essential (primary) hypertension: Secondary | ICD-10-CM | POA: Diagnosis not present

## 2016-12-06 DIAGNOSIS — Z993 Dependence on wheelchair: Secondary | ICD-10-CM | POA: Diagnosis not present

## 2016-12-13 DIAGNOSIS — Z7984 Long term (current) use of oral hypoglycemic drugs: Secondary | ICD-10-CM | POA: Diagnosis not present

## 2016-12-13 DIAGNOSIS — E1140 Type 2 diabetes mellitus with diabetic neuropathy, unspecified: Secondary | ICD-10-CM | POA: Diagnosis not present

## 2016-12-13 DIAGNOSIS — Z993 Dependence on wheelchair: Secondary | ICD-10-CM | POA: Diagnosis not present

## 2016-12-13 DIAGNOSIS — I69320 Aphasia following cerebral infarction: Secondary | ICD-10-CM | POA: Diagnosis not present

## 2016-12-13 DIAGNOSIS — I890 Lymphedema, not elsewhere classified: Secondary | ICD-10-CM | POA: Diagnosis not present

## 2016-12-13 DIAGNOSIS — I10 Essential (primary) hypertension: Secondary | ICD-10-CM | POA: Diagnosis not present

## 2016-12-18 DIAGNOSIS — Z23 Encounter for immunization: Secondary | ICD-10-CM | POA: Diagnosis not present

## 2017-01-16 DIAGNOSIS — K5900 Constipation, unspecified: Secondary | ICD-10-CM | POA: Diagnosis not present

## 2017-01-16 DIAGNOSIS — R109 Unspecified abdominal pain: Secondary | ICD-10-CM | POA: Diagnosis not present

## 2017-01-16 DIAGNOSIS — R269 Unspecified abnormalities of gait and mobility: Secondary | ICD-10-CM | POA: Diagnosis not present

## 2017-01-16 DIAGNOSIS — F5101 Primary insomnia: Secondary | ICD-10-CM | POA: Diagnosis not present

## 2017-01-16 DIAGNOSIS — G8929 Other chronic pain: Secondary | ICD-10-CM | POA: Diagnosis not present

## 2017-01-16 DIAGNOSIS — Z79899 Other long term (current) drug therapy: Secondary | ICD-10-CM | POA: Diagnosis not present

## 2017-01-16 DIAGNOSIS — I10 Essential (primary) hypertension: Secondary | ICD-10-CM | POA: Diagnosis not present

## 2017-01-17 DIAGNOSIS — R109 Unspecified abdominal pain: Secondary | ICD-10-CM | POA: Diagnosis not present

## 2017-01-18 DIAGNOSIS — Z79899 Other long term (current) drug therapy: Secondary | ICD-10-CM | POA: Diagnosis not present

## 2017-02-03 DIAGNOSIS — Z1212 Encounter for screening for malignant neoplasm of rectum: Secondary | ICD-10-CM | POA: Diagnosis not present

## 2017-02-03 DIAGNOSIS — Z1211 Encounter for screening for malignant neoplasm of colon: Secondary | ICD-10-CM | POA: Diagnosis not present

## 2017-02-05 DIAGNOSIS — Z79899 Other long term (current) drug therapy: Secondary | ICD-10-CM | POA: Diagnosis not present

## 2017-04-03 DIAGNOSIS — Z719 Counseling, unspecified: Secondary | ICD-10-CM | POA: Diagnosis not present

## 2017-04-03 DIAGNOSIS — K5909 Other constipation: Secondary | ICD-10-CM | POA: Diagnosis not present

## 2017-04-03 DIAGNOSIS — R195 Other fecal abnormalities: Secondary | ICD-10-CM | POA: Diagnosis not present

## 2017-04-08 DIAGNOSIS — Z01818 Encounter for other preprocedural examination: Secondary | ICD-10-CM | POA: Diagnosis not present

## 2017-04-12 DIAGNOSIS — K635 Polyp of colon: Secondary | ICD-10-CM | POA: Diagnosis not present

## 2017-04-12 DIAGNOSIS — Z7984 Long term (current) use of oral hypoglycemic drugs: Secondary | ICD-10-CM | POA: Diagnosis not present

## 2017-04-12 DIAGNOSIS — E119 Type 2 diabetes mellitus without complications: Secondary | ICD-10-CM | POA: Diagnosis not present

## 2017-04-12 DIAGNOSIS — D122 Benign neoplasm of ascending colon: Secondary | ICD-10-CM | POA: Diagnosis not present

## 2017-04-12 DIAGNOSIS — R195 Other fecal abnormalities: Secondary | ICD-10-CM | POA: Diagnosis not present

## 2017-04-12 DIAGNOSIS — E1122 Type 2 diabetes mellitus with diabetic chronic kidney disease: Secondary | ICD-10-CM | POA: Diagnosis not present

## 2017-04-12 DIAGNOSIS — I120 Hypertensive chronic kidney disease with stage 5 chronic kidney disease or end stage renal disease: Secondary | ICD-10-CM | POA: Diagnosis not present

## 2017-04-12 DIAGNOSIS — Z8673 Personal history of transient ischemic attack (TIA), and cerebral infarction without residual deficits: Secondary | ICD-10-CM | POA: Diagnosis not present

## 2017-04-12 DIAGNOSIS — Z1211 Encounter for screening for malignant neoplasm of colon: Secondary | ICD-10-CM | POA: Diagnosis not present

## 2017-04-12 DIAGNOSIS — N186 End stage renal disease: Secondary | ICD-10-CM | POA: Diagnosis not present

## 2017-04-12 DIAGNOSIS — I10 Essential (primary) hypertension: Secondary | ICD-10-CM | POA: Diagnosis not present

## 2017-04-12 DIAGNOSIS — Z87891 Personal history of nicotine dependence: Secondary | ICD-10-CM | POA: Diagnosis not present

## 2017-04-12 DIAGNOSIS — K5730 Diverticulosis of large intestine without perforation or abscess without bleeding: Secondary | ICD-10-CM | POA: Diagnosis not present

## 2017-05-13 DIAGNOSIS — E1140 Type 2 diabetes mellitus with diabetic neuropathy, unspecified: Secondary | ICD-10-CM | POA: Diagnosis not present

## 2017-05-13 DIAGNOSIS — E782 Mixed hyperlipidemia: Secondary | ICD-10-CM | POA: Diagnosis not present

## 2017-05-13 DIAGNOSIS — Z79899 Other long term (current) drug therapy: Secondary | ICD-10-CM | POA: Diagnosis not present

## 2017-06-11 DIAGNOSIS — I129 Hypertensive chronic kidney disease with stage 1 through stage 4 chronic kidney disease, or unspecified chronic kidney disease: Secondary | ICD-10-CM | POA: Diagnosis not present

## 2017-06-11 DIAGNOSIS — N189 Chronic kidney disease, unspecified: Secondary | ICD-10-CM | POA: Diagnosis not present

## 2017-06-11 DIAGNOSIS — E119 Type 2 diabetes mellitus without complications: Secondary | ICD-10-CM | POA: Diagnosis not present

## 2017-06-11 DIAGNOSIS — S8991XA Unspecified injury of right lower leg, initial encounter: Secondary | ICD-10-CM | POA: Diagnosis not present

## 2017-06-11 DIAGNOSIS — I10 Essential (primary) hypertension: Secondary | ICD-10-CM | POA: Diagnosis not present

## 2017-06-11 DIAGNOSIS — M79661 Pain in right lower leg: Secondary | ICD-10-CM | POA: Diagnosis not present

## 2017-06-11 DIAGNOSIS — Z79899 Other long term (current) drug therapy: Secondary | ICD-10-CM | POA: Diagnosis not present

## 2017-06-11 DIAGNOSIS — Z7901 Long term (current) use of anticoagulants: Secondary | ICD-10-CM | POA: Diagnosis not present

## 2017-06-11 DIAGNOSIS — Z7984 Long term (current) use of oral hypoglycemic drugs: Secondary | ICD-10-CM | POA: Diagnosis not present

## 2017-06-11 DIAGNOSIS — Z880 Allergy status to penicillin: Secondary | ICD-10-CM | POA: Diagnosis not present

## 2017-06-11 DIAGNOSIS — Z87891 Personal history of nicotine dependence: Secondary | ICD-10-CM | POA: Diagnosis not present

## 2017-06-11 DIAGNOSIS — L539 Erythematous condition, unspecified: Secondary | ICD-10-CM | POA: Diagnosis not present

## 2017-06-11 DIAGNOSIS — M79604 Pain in right leg: Secondary | ICD-10-CM | POA: Diagnosis not present

## 2017-06-11 DIAGNOSIS — L089 Local infection of the skin and subcutaneous tissue, unspecified: Secondary | ICD-10-CM | POA: Diagnosis not present

## 2017-06-11 DIAGNOSIS — Z8673 Personal history of transient ischemic attack (TIA), and cerebral infarction without residual deficits: Secondary | ICD-10-CM | POA: Diagnosis not present

## 2017-06-11 DIAGNOSIS — M7989 Other specified soft tissue disorders: Secondary | ICD-10-CM | POA: Diagnosis not present

## 2017-11-27 DIAGNOSIS — Z993 Dependence on wheelchair: Secondary | ICD-10-CM | POA: Diagnosis not present

## 2017-11-27 DIAGNOSIS — N5089 Other specified disorders of the male genital organs: Secondary | ICD-10-CM | POA: Diagnosis not present

## 2017-11-27 DIAGNOSIS — E1140 Type 2 diabetes mellitus with diabetic neuropathy, unspecified: Secondary | ICD-10-CM | POA: Diagnosis not present

## 2017-11-27 DIAGNOSIS — I10 Essential (primary) hypertension: Secondary | ICD-10-CM | POA: Diagnosis not present

## 2017-12-02 DIAGNOSIS — K769 Liver disease, unspecified: Secondary | ICD-10-CM | POA: Diagnosis not present

## 2017-12-02 DIAGNOSIS — Z79899 Other long term (current) drug therapy: Secondary | ICD-10-CM | POA: Diagnosis not present

## 2017-12-08 DIAGNOSIS — Z23 Encounter for immunization: Secondary | ICD-10-CM | POA: Diagnosis not present

## 2017-12-26 DIAGNOSIS — E782 Mixed hyperlipidemia: Secondary | ICD-10-CM | POA: Diagnosis not present

## 2017-12-26 DIAGNOSIS — Z79899 Other long term (current) drug therapy: Secondary | ICD-10-CM | POA: Diagnosis not present

## 2017-12-26 DIAGNOSIS — E1140 Type 2 diabetes mellitus with diabetic neuropathy, unspecified: Secondary | ICD-10-CM | POA: Diagnosis not present

## 2018-01-27 DIAGNOSIS — N433 Hydrocele, unspecified: Secondary | ICD-10-CM | POA: Diagnosis not present

## 2018-01-27 DIAGNOSIS — N5082 Scrotal pain: Secondary | ICD-10-CM | POA: Diagnosis not present

## 2018-01-27 DIAGNOSIS — R350 Frequency of micturition: Secondary | ICD-10-CM | POA: Diagnosis not present

## 2018-01-27 DIAGNOSIS — K5900 Constipation, unspecified: Secondary | ICD-10-CM | POA: Diagnosis not present

## 2018-02-19 DIAGNOSIS — N5082 Scrotal pain: Secondary | ICD-10-CM | POA: Diagnosis not present

## 2018-02-19 DIAGNOSIS — N433 Hydrocele, unspecified: Secondary | ICD-10-CM | POA: Diagnosis not present

## 2018-02-22 DIAGNOSIS — N433 Hydrocele, unspecified: Secondary | ICD-10-CM | POA: Diagnosis not present

## 2018-02-22 DIAGNOSIS — R52 Pain, unspecified: Secondary | ICD-10-CM | POA: Diagnosis not present

## 2018-02-22 DIAGNOSIS — R2243 Localized swelling, mass and lump, lower limb, bilateral: Secondary | ICD-10-CM | POA: Diagnosis not present

## 2018-02-22 DIAGNOSIS — R1030 Lower abdominal pain, unspecified: Secondary | ICD-10-CM | POA: Diagnosis not present

## 2018-02-22 DIAGNOSIS — Z87891 Personal history of nicotine dependence: Secondary | ICD-10-CM | POA: Diagnosis not present

## 2018-02-22 DIAGNOSIS — Z7984 Long term (current) use of oral hypoglycemic drugs: Secondary | ICD-10-CM | POA: Diagnosis not present

## 2018-02-22 DIAGNOSIS — Z880 Allergy status to penicillin: Secondary | ICD-10-CM | POA: Diagnosis not present

## 2018-02-22 DIAGNOSIS — B372 Candidiasis of skin and nail: Secondary | ICD-10-CM | POA: Diagnosis not present

## 2018-02-22 DIAGNOSIS — Z7401 Bed confinement status: Secondary | ICD-10-CM | POA: Diagnosis not present

## 2018-02-22 DIAGNOSIS — N189 Chronic kidney disease, unspecified: Secondary | ICD-10-CM | POA: Diagnosis not present

## 2018-02-22 DIAGNOSIS — M79604 Pain in right leg: Secondary | ICD-10-CM | POA: Diagnosis not present

## 2018-02-22 DIAGNOSIS — Z9049 Acquired absence of other specified parts of digestive tract: Secondary | ICD-10-CM | POA: Diagnosis not present

## 2018-02-22 DIAGNOSIS — R21 Rash and other nonspecific skin eruption: Secondary | ICD-10-CM | POA: Diagnosis not present

## 2018-02-22 DIAGNOSIS — R404 Transient alteration of awareness: Secondary | ICD-10-CM | POA: Diagnosis not present

## 2018-02-22 DIAGNOSIS — I6350 Cerebral infarction due to unspecified occlusion or stenosis of unspecified cerebral artery: Secondary | ICD-10-CM | POA: Diagnosis not present

## 2018-02-22 DIAGNOSIS — E1122 Type 2 diabetes mellitus with diabetic chronic kidney disease: Secondary | ICD-10-CM | POA: Diagnosis not present

## 2018-02-22 DIAGNOSIS — G459 Transient cerebral ischemic attack, unspecified: Secondary | ICD-10-CM | POA: Diagnosis not present

## 2018-02-22 DIAGNOSIS — I69320 Aphasia following cerebral infarction: Secondary | ICD-10-CM | POA: Diagnosis not present

## 2018-02-22 DIAGNOSIS — Z79899 Other long term (current) drug therapy: Secondary | ICD-10-CM | POA: Diagnosis not present

## 2018-02-22 DIAGNOSIS — R531 Weakness: Secondary | ICD-10-CM | POA: Diagnosis not present

## 2018-02-22 DIAGNOSIS — I129 Hypertensive chronic kidney disease with stage 1 through stage 4 chronic kidney disease, or unspecified chronic kidney disease: Secondary | ICD-10-CM | POA: Diagnosis not present

## 2018-02-22 DIAGNOSIS — Z7901 Long term (current) use of anticoagulants: Secondary | ICD-10-CM | POA: Diagnosis not present

## 2018-04-11 DIAGNOSIS — E1140 Type 2 diabetes mellitus with diabetic neuropathy, unspecified: Secondary | ICD-10-CM | POA: Diagnosis not present

## 2018-04-11 DIAGNOSIS — Z1389 Encounter for screening for other disorder: Secondary | ICD-10-CM | POA: Diagnosis not present

## 2018-04-11 DIAGNOSIS — I10 Essential (primary) hypertension: Secondary | ICD-10-CM | POA: Diagnosis not present

## 2018-04-11 DIAGNOSIS — I69320 Aphasia following cerebral infarction: Secondary | ICD-10-CM | POA: Diagnosis not present

## 2018-04-11 DIAGNOSIS — Z7984 Long term (current) use of oral hypoglycemic drugs: Secondary | ICD-10-CM | POA: Diagnosis not present

## 2018-04-11 DIAGNOSIS — G8191 Hemiplegia, unspecified affecting right dominant side: Secondary | ICD-10-CM | POA: Diagnosis not present

## 2018-04-11 DIAGNOSIS — G629 Polyneuropathy, unspecified: Secondary | ICD-10-CM | POA: Diagnosis not present

## 2018-04-11 DIAGNOSIS — N5089 Other specified disorders of the male genital organs: Secondary | ICD-10-CM | POA: Diagnosis not present

## 2018-04-11 DIAGNOSIS — E785 Hyperlipidemia, unspecified: Secondary | ICD-10-CM | POA: Diagnosis not present

## 2018-04-11 DIAGNOSIS — I890 Lymphedema, not elsewhere classified: Secondary | ICD-10-CM | POA: Diagnosis not present

## 2018-04-11 DIAGNOSIS — E782 Mixed hyperlipidemia: Secondary | ICD-10-CM | POA: Diagnosis not present

## 2018-04-11 DIAGNOSIS — N400 Enlarged prostate without lower urinary tract symptoms: Secondary | ICD-10-CM | POA: Diagnosis not present

## 2018-04-21 DIAGNOSIS — R4189 Other symptoms and signs involving cognitive functions and awareness: Secondary | ICD-10-CM | POA: Diagnosis not present

## 2018-04-21 DIAGNOSIS — G8191 Hemiplegia, unspecified affecting right dominant side: Secondary | ICD-10-CM | POA: Diagnosis not present

## 2018-04-21 DIAGNOSIS — I69320 Aphasia following cerebral infarction: Secondary | ICD-10-CM | POA: Diagnosis not present

## 2018-05-14 ENCOUNTER — Ambulatory Visit
Admission: RE | Admit: 2018-05-14 | Discharge: 2018-05-14 | Disposition: A | Discharge status: Home / Self Care | Payer: Self-pay | Source: Ambulatory Visit | Attending: Internal Medicine | Admitting: Internal Medicine

## 2018-05-14 ENCOUNTER — Other Ambulatory Visit: Payer: Self-pay | Admitting: Internal Medicine

## 2018-05-14 DIAGNOSIS — N200 Calculus of kidney: Secondary | ICD-10-CM | POA: Diagnosis not present

## 2018-05-14 DIAGNOSIS — R1010 Upper abdominal pain, unspecified: Secondary | ICD-10-CM | POA: Diagnosis not present

## 2018-05-14 DIAGNOSIS — N132 Hydronephrosis with renal and ureteral calculous obstruction: Secondary | ICD-10-CM | POA: Diagnosis not present

## 2018-05-14 DIAGNOSIS — R1031 Right lower quadrant pain: Secondary | ICD-10-CM | POA: Diagnosis not present

## 2018-05-14 DIAGNOSIS — R109 Unspecified abdominal pain: Secondary | ICD-10-CM

## 2018-05-14 MED ORDER — IOPAMIDOL (ISOVUE-300) INJECTION 61%
100.0000 mL | Freq: Once | INTRAVENOUS | Status: AC | PRN
  Administered 2018-05-14: 100 mL via INTRAVENOUS

## 2018-05-15 DIAGNOSIS — N401 Enlarged prostate with lower urinary tract symptoms: Secondary | ICD-10-CM | POA: Diagnosis not present

## 2018-05-15 DIAGNOSIS — N202 Calculus of kidney with calculus of ureter: Secondary | ICD-10-CM | POA: Diagnosis not present

## 2018-05-15 DIAGNOSIS — R350 Frequency of micturition: Secondary | ICD-10-CM | POA: Diagnosis not present

## 2018-05-26 DIAGNOSIS — N202 Calculus of kidney with calculus of ureter: Secondary | ICD-10-CM | POA: Diagnosis not present

## 2018-07-23 DIAGNOSIS — I69320 Aphasia following cerebral infarction: Secondary | ICD-10-CM | POA: Diagnosis not present

## 2018-07-23 DIAGNOSIS — G8191 Hemiplegia, unspecified affecting right dominant side: Secondary | ICD-10-CM | POA: Diagnosis not present

## 2018-07-23 DIAGNOSIS — E1140 Type 2 diabetes mellitus with diabetic neuropathy, unspecified: Secondary | ICD-10-CM | POA: Diagnosis not present

## 2018-07-23 DIAGNOSIS — Z7984 Long term (current) use of oral hypoglycemic drugs: Secondary | ICD-10-CM | POA: Diagnosis not present

## 2018-10-02 DIAGNOSIS — E785 Hyperlipidemia, unspecified: Secondary | ICD-10-CM | POA: Diagnosis not present

## 2018-10-02 DIAGNOSIS — I10 Essential (primary) hypertension: Secondary | ICD-10-CM | POA: Diagnosis not present

## 2018-10-02 DIAGNOSIS — G629 Polyneuropathy, unspecified: Secondary | ICD-10-CM | POA: Diagnosis not present

## 2018-10-02 DIAGNOSIS — N400 Enlarged prostate without lower urinary tract symptoms: Secondary | ICD-10-CM | POA: Diagnosis not present

## 2018-10-02 DIAGNOSIS — I872 Venous insufficiency (chronic) (peripheral): Secondary | ICD-10-CM | POA: Diagnosis not present

## 2018-10-02 DIAGNOSIS — G8191 Hemiplegia, unspecified affecting right dominant side: Secondary | ICD-10-CM | POA: Diagnosis not present

## 2018-10-02 DIAGNOSIS — Z7984 Long term (current) use of oral hypoglycemic drugs: Secondary | ICD-10-CM | POA: Diagnosis not present

## 2018-10-02 DIAGNOSIS — E1140 Type 2 diabetes mellitus with diabetic neuropathy, unspecified: Secondary | ICD-10-CM | POA: Diagnosis not present

## 2018-10-02 DIAGNOSIS — I890 Lymphedema, not elsewhere classified: Secondary | ICD-10-CM | POA: Diagnosis not present

## 2018-10-02 DIAGNOSIS — I69320 Aphasia following cerebral infarction: Secondary | ICD-10-CM | POA: Diagnosis not present

## 2018-11-12 DIAGNOSIS — I69320 Aphasia following cerebral infarction: Secondary | ICD-10-CM | POA: Diagnosis not present

## 2018-11-12 DIAGNOSIS — I872 Venous insufficiency (chronic) (peripheral): Secondary | ICD-10-CM | POA: Diagnosis not present

## 2018-11-12 DIAGNOSIS — G629 Polyneuropathy, unspecified: Secondary | ICD-10-CM | POA: Diagnosis not present

## 2018-11-12 DIAGNOSIS — G8191 Hemiplegia, unspecified affecting right dominant side: Secondary | ICD-10-CM | POA: Diagnosis not present

## 2018-11-12 DIAGNOSIS — I890 Lymphedema, not elsewhere classified: Secondary | ICD-10-CM | POA: Diagnosis not present

## 2018-11-12 DIAGNOSIS — Z23 Encounter for immunization: Secondary | ICD-10-CM | POA: Diagnosis not present

## 2018-11-12 DIAGNOSIS — Z111 Encounter for screening for respiratory tuberculosis: Secondary | ICD-10-CM | POA: Diagnosis not present

## 2018-11-27 DIAGNOSIS — N202 Calculus of kidney with calculus of ureter: Secondary | ICD-10-CM | POA: Diagnosis not present

## 2018-11-27 DIAGNOSIS — R310 Gross hematuria: Secondary | ICD-10-CM | POA: Diagnosis not present

## 2018-11-28 DIAGNOSIS — E119 Type 2 diabetes mellitus without complications: Secondary | ICD-10-CM | POA: Diagnosis not present

## 2018-11-28 DIAGNOSIS — H5213 Myopia, bilateral: Secondary | ICD-10-CM | POA: Diagnosis not present

## 2018-11-28 DIAGNOSIS — H524 Presbyopia: Secondary | ICD-10-CM | POA: Diagnosis not present

## 2018-11-28 DIAGNOSIS — H2513 Age-related nuclear cataract, bilateral: Secondary | ICD-10-CM | POA: Diagnosis not present

## 2018-11-28 DIAGNOSIS — H52223 Regular astigmatism, bilateral: Secondary | ICD-10-CM | POA: Diagnosis not present

## 2018-12-22 DIAGNOSIS — E119 Type 2 diabetes mellitus without complications: Secondary | ICD-10-CM | POA: Diagnosis not present

## 2018-12-22 DIAGNOSIS — H2513 Age-related nuclear cataract, bilateral: Secondary | ICD-10-CM | POA: Diagnosis not present

## 2018-12-22 DIAGNOSIS — H34232 Retinal artery branch occlusion, left eye: Secondary | ICD-10-CM | POA: Diagnosis not present

## 2018-12-31 DIAGNOSIS — I10 Essential (primary) hypertension: Secondary | ICD-10-CM | POA: Diagnosis not present

## 2019-01-01 ENCOUNTER — Other Ambulatory Visit: Payer: Self-pay

## 2019-01-01 ENCOUNTER — Ambulatory Visit: Payer: Medicare Other | Attending: Internal Medicine | Admitting: Physical Therapy

## 2019-01-01 DIAGNOSIS — M6281 Muscle weakness (generalized): Secondary | ICD-10-CM | POA: Diagnosis not present

## 2019-01-01 DIAGNOSIS — I69351 Hemiplegia and hemiparesis following cerebral infarction affecting right dominant side: Secondary | ICD-10-CM | POA: Diagnosis not present

## 2019-01-01 DIAGNOSIS — R2689 Other abnormalities of gait and mobility: Secondary | ICD-10-CM | POA: Insufficient documentation

## 2019-01-02 ENCOUNTER — Encounter: Payer: Self-pay | Admitting: Physical Therapy

## 2019-01-02 NOTE — Therapy (Signed)
[facility] [address] [place], [place], [address] Phone: [phone]   Fax:  [phone]  Physical Therapy Evaluation  Patient Details  Name: Jose Kim MRN: [id number] Date of Birth: May 12, 1950 Referring Provider (PT): Berlin Hun, MD   Encounter Date: 01/01/2019  PT End of Session - 01/02/19 2036    Visit Number  1    Authorization Type  Medicare    Authorization Time Period  01-01-19 - 02-01-19    PT Start Time  1020    PT Stop Time  1137    PT Time Calculation (min)  77 min    Activity Tolerance  Patient tolerated treatment well    Behavior During Therapy  [facility] for tasks assessed/performed       History reviewed. No pertinent past medical history.  History reviewed. No pertinent surgical history.  There were no vitals filed for this visit.   Subjective Assessment - 01/02/19 2029    Subjective  Pt presents for power wheelchair - accompanied by his sister-in-law; pt in transport wheelchair; Deberah Pelton, ATP with [employer] present for eval    Patient is accompained by:  Family member    Pertinent History  Lt CVA with Rt hemiplegia (2003)    Patient Stated Goals  obtain power wheelchair    Currently in Pain?  No/denies         [facility] PT Assessment - 01/02/19 0001      Assessment   Medical Diagnosis  h/o Lt CVA with Rt hemiplegia    Referring Provider (PT)  Berlin Hun, MD    Onset Date/Surgical Date  --   2003   Hand Dominance  Left   since CVA     Precautions   Precautions  Fall      Balance Screen   Has the patient fallen in the past 6 months  No    Has the patient had a decrease in activity level because of a fear of falling?   Yes    Is the patient reluctant to leave their home because of a fear of falling?   No      Home Environment   Living Environment  Private residence    Available Help at Discharge  Family    Type of Muse to enter    Entrance Stairs-Number of Steps  2    [place]  One level      Prior Function   Level of Independence  Needs assistance with ADLs;Needs assistance with homemaking;Needs assistance with transfers                Objective measurements completed on examination: See above findings.      LMN for power wheelchair with power tilt & recline to be completed - Deberah Pelton, [employer] with [employer] present for eval                     Plan - 01/02/19 2037    Clinical Impression Statement  Pt evaluated for group 3 power wheelchair with power tilt and recline to allow independence with pressure relief and for pt to be independent with mobility in his home environment.  Eval completed with Deberah Pelton, ATP with [employer]    Personal Factors and Comorbidities  Comorbidity 1    Examination-Activity Limitations  Stairs;Stand;Squat;Locomotion Level;Transfers;Bed Mobility;Toileting    Examination-Participation Restrictions  Shop;Meal Prep;Community Activity;Driving;Yard Work    Stability/Clinical Decision Making  Stable/Uncomplicated  Clinical Decision Making  Low    Rehab Potential  Good    PT Frequency  One time visit    PT Treatment/Interventions  Other (comment)   wheelchair management   PT Next Visit Plan  N/A - w/c eval only    Recommended Other Services  power w/c eval with [employer] (Brandon Sisk, [employer])    Consulted and Agree with Plan of Care  Patient;Family member/caregiver    Family Member Consulted  sister in law       Patient will benefit from skilled therapeutic intervention in order to improve the following deficits and impairments:  Decreased strength, Decreased mobility, Difficulty walking, Decreased balance, Increased edema  Visit Diagnosis: Hemiplegia and hemiparesis following cerebral infarction affecting right dominant side ([place]) - Plan: PT plan of care cert/re-cert  Muscle weakness (generalized) - Plan: PT plan of care cert/re-cert  Other  abnormalities of gait and mobility - Plan: PT plan of care cert/re-cert     Problem List There are no active problems to display for this patient.   Alda Lea, PT 01/02/2019, 8:48 PM  [facility] [address] [place], [place], [address] Phone: [phone]   Fax:  [phone]  Name: Carter Auth MRN: [id number] Date of Birth: 12/11/1950

## 2019-01-28 DIAGNOSIS — I10 Essential (primary) hypertension: Secondary | ICD-10-CM | POA: Diagnosis not present

## 2019-03-23 DIAGNOSIS — H34232 Retinal artery branch occlusion, left eye: Secondary | ICD-10-CM | POA: Diagnosis not present

## 2019-03-23 DIAGNOSIS — H40003 Preglaucoma, unspecified, bilateral: Secondary | ICD-10-CM | POA: Diagnosis not present

## 2019-03-25 DIAGNOSIS — Z0189 Encounter for other specified special examinations: Secondary | ICD-10-CM | POA: Diagnosis not present

## 2019-03-25 DIAGNOSIS — I69320 Aphasia following cerebral infarction: Secondary | ICD-10-CM | POA: Diagnosis not present

## 2019-03-25 DIAGNOSIS — I10 Essential (primary) hypertension: Secondary | ICD-10-CM | POA: Diagnosis not present

## 2019-03-25 DIAGNOSIS — G8191 Hemiplegia, unspecified affecting right dominant side: Secondary | ICD-10-CM | POA: Diagnosis not present

## 2019-03-25 DIAGNOSIS — I872 Venous insufficiency (chronic) (peripheral): Secondary | ICD-10-CM | POA: Diagnosis not present

## 2019-03-30 DIAGNOSIS — I69320 Aphasia following cerebral infarction: Secondary | ICD-10-CM | POA: Diagnosis not present

## 2019-03-30 DIAGNOSIS — Z0189 Encounter for other specified special examinations: Secondary | ICD-10-CM | POA: Diagnosis not present

## 2019-04-02 DIAGNOSIS — I10 Essential (primary) hypertension: Secondary | ICD-10-CM | POA: Diagnosis not present

## 2019-04-02 DIAGNOSIS — I69351 Hemiplegia and hemiparesis following cerebral infarction affecting right dominant side: Secondary | ICD-10-CM | POA: Diagnosis not present

## 2019-04-02 DIAGNOSIS — G464 Cerebellar stroke syndrome: Secondary | ICD-10-CM | POA: Diagnosis not present

## 2019-04-02 DIAGNOSIS — R2681 Unsteadiness on feet: Secondary | ICD-10-CM | POA: Diagnosis not present

## 2019-04-02 DIAGNOSIS — M6281 Muscle weakness (generalized): Secondary | ICD-10-CM | POA: Diagnosis not present

## 2019-04-02 DIAGNOSIS — R278 Other lack of coordination: Secondary | ICD-10-CM | POA: Diagnosis not present

## 2019-04-02 DIAGNOSIS — G8929 Other chronic pain: Secondary | ICD-10-CM | POA: Diagnosis not present

## 2019-04-02 DIAGNOSIS — I69320 Aphasia following cerebral infarction: Secondary | ICD-10-CM | POA: Diagnosis not present

## 2019-04-03 DIAGNOSIS — R278 Other lack of coordination: Secondary | ICD-10-CM | POA: Diagnosis not present

## 2019-04-03 DIAGNOSIS — M6281 Muscle weakness (generalized): Secondary | ICD-10-CM | POA: Diagnosis not present

## 2019-04-03 DIAGNOSIS — G8929 Other chronic pain: Secondary | ICD-10-CM | POA: Diagnosis not present

## 2019-04-03 DIAGNOSIS — R2681 Unsteadiness on feet: Secondary | ICD-10-CM | POA: Diagnosis not present

## 2019-04-03 DIAGNOSIS — I69320 Aphasia following cerebral infarction: Secondary | ICD-10-CM | POA: Diagnosis not present

## 2019-04-03 DIAGNOSIS — I69351 Hemiplegia and hemiparesis following cerebral infarction affecting right dominant side: Secondary | ICD-10-CM | POA: Diagnosis not present

## 2019-04-05 DIAGNOSIS — R278 Other lack of coordination: Secondary | ICD-10-CM | POA: Diagnosis not present

## 2019-04-05 DIAGNOSIS — I69320 Aphasia following cerebral infarction: Secondary | ICD-10-CM | POA: Diagnosis not present

## 2019-04-05 DIAGNOSIS — I69351 Hemiplegia and hemiparesis following cerebral infarction affecting right dominant side: Secondary | ICD-10-CM | POA: Diagnosis not present

## 2019-04-05 DIAGNOSIS — R2681 Unsteadiness on feet: Secondary | ICD-10-CM | POA: Diagnosis not present

## 2019-04-05 DIAGNOSIS — G8929 Other chronic pain: Secondary | ICD-10-CM | POA: Diagnosis not present

## 2019-04-05 DIAGNOSIS — M6281 Muscle weakness (generalized): Secondary | ICD-10-CM | POA: Diagnosis not present

## 2019-04-06 DIAGNOSIS — I69320 Aphasia following cerebral infarction: Secondary | ICD-10-CM | POA: Diagnosis not present

## 2019-04-06 DIAGNOSIS — R278 Other lack of coordination: Secondary | ICD-10-CM | POA: Diagnosis not present

## 2019-04-06 DIAGNOSIS — R2681 Unsteadiness on feet: Secondary | ICD-10-CM | POA: Diagnosis not present

## 2019-04-06 DIAGNOSIS — I69351 Hemiplegia and hemiparesis following cerebral infarction affecting right dominant side: Secondary | ICD-10-CM | POA: Diagnosis not present

## 2019-04-06 DIAGNOSIS — M6281 Muscle weakness (generalized): Secondary | ICD-10-CM | POA: Diagnosis not present

## 2019-04-06 DIAGNOSIS — G8929 Other chronic pain: Secondary | ICD-10-CM | POA: Diagnosis not present

## 2019-04-07 DIAGNOSIS — R278 Other lack of coordination: Secondary | ICD-10-CM | POA: Diagnosis not present

## 2019-04-07 DIAGNOSIS — R2681 Unsteadiness on feet: Secondary | ICD-10-CM | POA: Diagnosis not present

## 2019-04-07 DIAGNOSIS — G8929 Other chronic pain: Secondary | ICD-10-CM | POA: Diagnosis not present

## 2019-04-07 DIAGNOSIS — M6281 Muscle weakness (generalized): Secondary | ICD-10-CM | POA: Diagnosis not present

## 2019-04-07 DIAGNOSIS — I69320 Aphasia following cerebral infarction: Secondary | ICD-10-CM | POA: Diagnosis not present

## 2019-04-07 DIAGNOSIS — I69351 Hemiplegia and hemiparesis following cerebral infarction affecting right dominant side: Secondary | ICD-10-CM | POA: Diagnosis not present

## 2019-04-09 DIAGNOSIS — I69351 Hemiplegia and hemiparesis following cerebral infarction affecting right dominant side: Secondary | ICD-10-CM | POA: Diagnosis not present

## 2019-04-09 DIAGNOSIS — I69320 Aphasia following cerebral infarction: Secondary | ICD-10-CM | POA: Diagnosis not present

## 2019-04-09 DIAGNOSIS — R2681 Unsteadiness on feet: Secondary | ICD-10-CM | POA: Diagnosis not present

## 2019-04-09 DIAGNOSIS — R278 Other lack of coordination: Secondary | ICD-10-CM | POA: Diagnosis not present

## 2019-04-09 DIAGNOSIS — M6281 Muscle weakness (generalized): Secondary | ICD-10-CM | POA: Diagnosis not present

## 2019-04-09 DIAGNOSIS — G8929 Other chronic pain: Secondary | ICD-10-CM | POA: Diagnosis not present

## 2019-04-10 DIAGNOSIS — G8929 Other chronic pain: Secondary | ICD-10-CM | POA: Diagnosis not present

## 2019-04-10 DIAGNOSIS — R2681 Unsteadiness on feet: Secondary | ICD-10-CM | POA: Diagnosis not present

## 2019-04-10 DIAGNOSIS — M6281 Muscle weakness (generalized): Secondary | ICD-10-CM | POA: Diagnosis not present

## 2019-04-10 DIAGNOSIS — I69351 Hemiplegia and hemiparesis following cerebral infarction affecting right dominant side: Secondary | ICD-10-CM | POA: Diagnosis not present

## 2019-04-10 DIAGNOSIS — I69320 Aphasia following cerebral infarction: Secondary | ICD-10-CM | POA: Diagnosis not present

## 2019-04-10 DIAGNOSIS — R278 Other lack of coordination: Secondary | ICD-10-CM | POA: Diagnosis not present

## 2019-04-14 DIAGNOSIS — I69320 Aphasia following cerebral infarction: Secondary | ICD-10-CM | POA: Diagnosis not present

## 2019-04-14 DIAGNOSIS — M6281 Muscle weakness (generalized): Secondary | ICD-10-CM | POA: Diagnosis not present

## 2019-04-14 DIAGNOSIS — I69351 Hemiplegia and hemiparesis following cerebral infarction affecting right dominant side: Secondary | ICD-10-CM | POA: Diagnosis not present

## 2019-04-14 DIAGNOSIS — I10 Essential (primary) hypertension: Secondary | ICD-10-CM | POA: Diagnosis not present

## 2019-04-14 DIAGNOSIS — G8929 Other chronic pain: Secondary | ICD-10-CM | POA: Diagnosis not present

## 2019-04-14 DIAGNOSIS — R2681 Unsteadiness on feet: Secondary | ICD-10-CM | POA: Diagnosis not present

## 2019-04-14 DIAGNOSIS — R278 Other lack of coordination: Secondary | ICD-10-CM | POA: Diagnosis not present

## 2019-04-15 DIAGNOSIS — R2681 Unsteadiness on feet: Secondary | ICD-10-CM | POA: Diagnosis not present

## 2019-04-15 DIAGNOSIS — I69351 Hemiplegia and hemiparesis following cerebral infarction affecting right dominant side: Secondary | ICD-10-CM | POA: Diagnosis not present

## 2019-04-15 DIAGNOSIS — M6281 Muscle weakness (generalized): Secondary | ICD-10-CM | POA: Diagnosis not present

## 2019-04-15 DIAGNOSIS — G8929 Other chronic pain: Secondary | ICD-10-CM | POA: Diagnosis not present

## 2019-04-15 DIAGNOSIS — R278 Other lack of coordination: Secondary | ICD-10-CM | POA: Diagnosis not present

## 2019-04-15 DIAGNOSIS — I69320 Aphasia following cerebral infarction: Secondary | ICD-10-CM | POA: Diagnosis not present

## 2019-04-16 DIAGNOSIS — M6281 Muscle weakness (generalized): Secondary | ICD-10-CM | POA: Diagnosis not present

## 2019-04-16 DIAGNOSIS — I69351 Hemiplegia and hemiparesis following cerebral infarction affecting right dominant side: Secondary | ICD-10-CM | POA: Diagnosis not present

## 2019-04-16 DIAGNOSIS — I69320 Aphasia following cerebral infarction: Secondary | ICD-10-CM | POA: Diagnosis not present

## 2019-04-16 DIAGNOSIS — R278 Other lack of coordination: Secondary | ICD-10-CM | POA: Diagnosis not present

## 2019-04-16 DIAGNOSIS — G8929 Other chronic pain: Secondary | ICD-10-CM | POA: Diagnosis not present

## 2019-04-16 DIAGNOSIS — R2681 Unsteadiness on feet: Secondary | ICD-10-CM | POA: Diagnosis not present

## 2019-04-17 DIAGNOSIS — M6281 Muscle weakness (generalized): Secondary | ICD-10-CM | POA: Diagnosis not present

## 2019-04-17 DIAGNOSIS — G8929 Other chronic pain: Secondary | ICD-10-CM | POA: Diagnosis not present

## 2019-04-17 DIAGNOSIS — R2681 Unsteadiness on feet: Secondary | ICD-10-CM | POA: Diagnosis not present

## 2019-04-17 DIAGNOSIS — I69351 Hemiplegia and hemiparesis following cerebral infarction affecting right dominant side: Secondary | ICD-10-CM | POA: Diagnosis not present

## 2019-04-17 DIAGNOSIS — I69320 Aphasia following cerebral infarction: Secondary | ICD-10-CM | POA: Diagnosis not present

## 2019-04-17 DIAGNOSIS — R278 Other lack of coordination: Secondary | ICD-10-CM | POA: Diagnosis not present

## 2019-04-20 DIAGNOSIS — I69320 Aphasia following cerebral infarction: Secondary | ICD-10-CM | POA: Diagnosis not present

## 2019-04-20 DIAGNOSIS — G8929 Other chronic pain: Secondary | ICD-10-CM | POA: Diagnosis not present

## 2019-04-20 DIAGNOSIS — I69351 Hemiplegia and hemiparesis following cerebral infarction affecting right dominant side: Secondary | ICD-10-CM | POA: Diagnosis not present

## 2019-04-20 DIAGNOSIS — R2681 Unsteadiness on feet: Secondary | ICD-10-CM | POA: Diagnosis not present

## 2019-04-20 DIAGNOSIS — R278 Other lack of coordination: Secondary | ICD-10-CM | POA: Diagnosis not present

## 2019-04-20 DIAGNOSIS — Z23 Encounter for immunization: Secondary | ICD-10-CM | POA: Diagnosis not present

## 2019-04-20 DIAGNOSIS — M6281 Muscle weakness (generalized): Secondary | ICD-10-CM | POA: Diagnosis not present

## 2019-04-23 DIAGNOSIS — I69351 Hemiplegia and hemiparesis following cerebral infarction affecting right dominant side: Secondary | ICD-10-CM | POA: Diagnosis not present

## 2019-04-23 DIAGNOSIS — G8929 Other chronic pain: Secondary | ICD-10-CM | POA: Diagnosis not present

## 2019-04-23 DIAGNOSIS — I69320 Aphasia following cerebral infarction: Secondary | ICD-10-CM | POA: Diagnosis not present

## 2019-04-23 DIAGNOSIS — R278 Other lack of coordination: Secondary | ICD-10-CM | POA: Diagnosis not present

## 2019-04-23 DIAGNOSIS — M6281 Muscle weakness (generalized): Secondary | ICD-10-CM | POA: Diagnosis not present

## 2019-04-23 DIAGNOSIS — R2681 Unsteadiness on feet: Secondary | ICD-10-CM | POA: Diagnosis not present

## 2019-04-24 DIAGNOSIS — R2681 Unsteadiness on feet: Secondary | ICD-10-CM | POA: Diagnosis not present

## 2019-04-24 DIAGNOSIS — R278 Other lack of coordination: Secondary | ICD-10-CM | POA: Diagnosis not present

## 2019-04-24 DIAGNOSIS — G8929 Other chronic pain: Secondary | ICD-10-CM | POA: Diagnosis not present

## 2019-04-24 DIAGNOSIS — I69320 Aphasia following cerebral infarction: Secondary | ICD-10-CM | POA: Diagnosis not present

## 2019-04-24 DIAGNOSIS — I69351 Hemiplegia and hemiparesis following cerebral infarction affecting right dominant side: Secondary | ICD-10-CM | POA: Diagnosis not present

## 2019-04-24 DIAGNOSIS — M6281 Muscle weakness (generalized): Secondary | ICD-10-CM | POA: Diagnosis not present

## 2019-04-28 DIAGNOSIS — I69351 Hemiplegia and hemiparesis following cerebral infarction affecting right dominant side: Secondary | ICD-10-CM | POA: Diagnosis not present

## 2019-04-28 DIAGNOSIS — M6281 Muscle weakness (generalized): Secondary | ICD-10-CM | POA: Diagnosis not present

## 2019-04-28 DIAGNOSIS — R278 Other lack of coordination: Secondary | ICD-10-CM | POA: Diagnosis not present

## 2019-04-28 DIAGNOSIS — I69320 Aphasia following cerebral infarction: Secondary | ICD-10-CM | POA: Diagnosis not present

## 2019-04-28 DIAGNOSIS — R2681 Unsteadiness on feet: Secondary | ICD-10-CM | POA: Diagnosis not present

## 2019-04-28 DIAGNOSIS — G8929 Other chronic pain: Secondary | ICD-10-CM | POA: Diagnosis not present

## 2019-04-29 DIAGNOSIS — R278 Other lack of coordination: Secondary | ICD-10-CM | POA: Diagnosis not present

## 2019-04-29 DIAGNOSIS — I69351 Hemiplegia and hemiparesis following cerebral infarction affecting right dominant side: Secondary | ICD-10-CM | POA: Diagnosis not present

## 2019-04-29 DIAGNOSIS — R2681 Unsteadiness on feet: Secondary | ICD-10-CM | POA: Diagnosis not present

## 2019-04-29 DIAGNOSIS — I69320 Aphasia following cerebral infarction: Secondary | ICD-10-CM | POA: Diagnosis not present

## 2019-04-29 DIAGNOSIS — G8929 Other chronic pain: Secondary | ICD-10-CM | POA: Diagnosis not present

## 2019-04-29 DIAGNOSIS — M6281 Muscle weakness (generalized): Secondary | ICD-10-CM | POA: Diagnosis not present

## 2019-04-30 DIAGNOSIS — E1143 Type 2 diabetes mellitus with diabetic autonomic (poly)neuropathy: Secondary | ICD-10-CM | POA: Diagnosis not present

## 2019-04-30 DIAGNOSIS — I10 Essential (primary) hypertension: Secondary | ICD-10-CM | POA: Diagnosis not present

## 2019-04-30 DIAGNOSIS — Z7984 Long term (current) use of oral hypoglycemic drugs: Secondary | ICD-10-CM | POA: Diagnosis not present

## 2019-05-01 DIAGNOSIS — R278 Other lack of coordination: Secondary | ICD-10-CM | POA: Diagnosis not present

## 2019-05-01 DIAGNOSIS — R2681 Unsteadiness on feet: Secondary | ICD-10-CM | POA: Diagnosis not present

## 2019-05-01 DIAGNOSIS — I69351 Hemiplegia and hemiparesis following cerebral infarction affecting right dominant side: Secondary | ICD-10-CM | POA: Diagnosis not present

## 2019-05-01 DIAGNOSIS — M6281 Muscle weakness (generalized): Secondary | ICD-10-CM | POA: Diagnosis not present

## 2019-05-01 DIAGNOSIS — I69320 Aphasia following cerebral infarction: Secondary | ICD-10-CM | POA: Diagnosis not present

## 2019-05-01 DIAGNOSIS — G8929 Other chronic pain: Secondary | ICD-10-CM | POA: Diagnosis not present

## 2019-05-04 DIAGNOSIS — R2681 Unsteadiness on feet: Secondary | ICD-10-CM | POA: Diagnosis not present

## 2019-05-04 DIAGNOSIS — G8929 Other chronic pain: Secondary | ICD-10-CM | POA: Diagnosis not present

## 2019-05-04 DIAGNOSIS — M6281 Muscle weakness (generalized): Secondary | ICD-10-CM | POA: Diagnosis not present

## 2019-05-04 DIAGNOSIS — R278 Other lack of coordination: Secondary | ICD-10-CM | POA: Diagnosis not present

## 2019-05-04 DIAGNOSIS — I69351 Hemiplegia and hemiparesis following cerebral infarction affecting right dominant side: Secondary | ICD-10-CM | POA: Diagnosis not present

## 2019-05-04 DIAGNOSIS — I69320 Aphasia following cerebral infarction: Secondary | ICD-10-CM | POA: Diagnosis not present

## 2019-05-05 DIAGNOSIS — M6281 Muscle weakness (generalized): Secondary | ICD-10-CM | POA: Diagnosis not present

## 2019-05-05 DIAGNOSIS — R278 Other lack of coordination: Secondary | ICD-10-CM | POA: Diagnosis not present

## 2019-05-05 DIAGNOSIS — I69351 Hemiplegia and hemiparesis following cerebral infarction affecting right dominant side: Secondary | ICD-10-CM | POA: Diagnosis not present

## 2019-05-05 DIAGNOSIS — R2681 Unsteadiness on feet: Secondary | ICD-10-CM | POA: Diagnosis not present

## 2019-05-05 DIAGNOSIS — I69320 Aphasia following cerebral infarction: Secondary | ICD-10-CM | POA: Diagnosis not present

## 2019-05-05 DIAGNOSIS — G8929 Other chronic pain: Secondary | ICD-10-CM | POA: Diagnosis not present

## 2019-05-06 DIAGNOSIS — M6281 Muscle weakness (generalized): Secondary | ICD-10-CM | POA: Diagnosis not present

## 2019-05-06 DIAGNOSIS — I69351 Hemiplegia and hemiparesis following cerebral infarction affecting right dominant side: Secondary | ICD-10-CM | POA: Diagnosis not present

## 2019-05-06 DIAGNOSIS — R2681 Unsteadiness on feet: Secondary | ICD-10-CM | POA: Diagnosis not present

## 2019-05-06 DIAGNOSIS — I69320 Aphasia following cerebral infarction: Secondary | ICD-10-CM | POA: Diagnosis not present

## 2019-05-06 DIAGNOSIS — R278 Other lack of coordination: Secondary | ICD-10-CM | POA: Diagnosis not present

## 2019-05-06 DIAGNOSIS — G8929 Other chronic pain: Secondary | ICD-10-CM | POA: Diagnosis not present

## 2019-05-07 DIAGNOSIS — I69320 Aphasia following cerebral infarction: Secondary | ICD-10-CM | POA: Diagnosis not present

## 2019-05-07 DIAGNOSIS — I69351 Hemiplegia and hemiparesis following cerebral infarction affecting right dominant side: Secondary | ICD-10-CM | POA: Diagnosis not present

## 2019-05-07 DIAGNOSIS — R278 Other lack of coordination: Secondary | ICD-10-CM | POA: Diagnosis not present

## 2019-05-07 DIAGNOSIS — M6281 Muscle weakness (generalized): Secondary | ICD-10-CM | POA: Diagnosis not present

## 2019-05-07 DIAGNOSIS — G8929 Other chronic pain: Secondary | ICD-10-CM | POA: Diagnosis not present

## 2019-05-07 DIAGNOSIS — R2681 Unsteadiness on feet: Secondary | ICD-10-CM | POA: Diagnosis not present

## 2019-05-11 DIAGNOSIS — I69320 Aphasia following cerebral infarction: Secondary | ICD-10-CM | POA: Diagnosis not present

## 2019-05-11 DIAGNOSIS — I69351 Hemiplegia and hemiparesis following cerebral infarction affecting right dominant side: Secondary | ICD-10-CM | POA: Diagnosis not present

## 2019-05-11 DIAGNOSIS — M6281 Muscle weakness (generalized): Secondary | ICD-10-CM | POA: Diagnosis not present

## 2019-05-11 DIAGNOSIS — R2681 Unsteadiness on feet: Secondary | ICD-10-CM | POA: Diagnosis not present

## 2019-05-11 DIAGNOSIS — R278 Other lack of coordination: Secondary | ICD-10-CM | POA: Diagnosis not present

## 2019-05-11 DIAGNOSIS — G8929 Other chronic pain: Secondary | ICD-10-CM | POA: Diagnosis not present

## 2019-05-12 DIAGNOSIS — G4700 Insomnia, unspecified: Secondary | ICD-10-CM | POA: Diagnosis not present

## 2019-05-16 DIAGNOSIS — Z23 Encounter for immunization: Secondary | ICD-10-CM | POA: Diagnosis not present

## 2019-05-22 DIAGNOSIS — I679 Cerebrovascular disease, unspecified: Secondary | ICD-10-CM | POA: Diagnosis not present

## 2019-05-22 DIAGNOSIS — E1159 Type 2 diabetes mellitus with other circulatory complications: Secondary | ICD-10-CM | POA: Diagnosis not present

## 2019-05-22 DIAGNOSIS — I69320 Aphasia following cerebral infarction: Secondary | ICD-10-CM | POA: Diagnosis not present

## 2019-05-22 DIAGNOSIS — I10 Essential (primary) hypertension: Secondary | ICD-10-CM | POA: Diagnosis not present

## 2019-06-08 DIAGNOSIS — E1143 Type 2 diabetes mellitus with diabetic autonomic (poly)neuropathy: Secondary | ICD-10-CM | POA: Diagnosis not present

## 2019-06-08 DIAGNOSIS — G464 Cerebellar stroke syndrome: Secondary | ICD-10-CM | POA: Diagnosis not present

## 2019-06-22 DIAGNOSIS — I10 Essential (primary) hypertension: Secondary | ICD-10-CM | POA: Diagnosis not present

## 2019-06-22 DIAGNOSIS — M79631 Pain in right forearm: Secondary | ICD-10-CM | POA: Diagnosis not present

## 2019-06-22 DIAGNOSIS — L539 Erythematous condition, unspecified: Secondary | ICD-10-CM | POA: Diagnosis not present

## 2019-06-22 DIAGNOSIS — E785 Hyperlipidemia, unspecified: Secondary | ICD-10-CM | POA: Diagnosis not present

## 2019-06-22 DIAGNOSIS — S5011XA Contusion of right forearm, initial encounter: Secondary | ICD-10-CM | POA: Diagnosis not present

## 2019-06-23 DIAGNOSIS — E119 Type 2 diabetes mellitus without complications: Secondary | ICD-10-CM | POA: Diagnosis not present

## 2019-06-23 DIAGNOSIS — J310 Chronic rhinitis: Secondary | ICD-10-CM | POA: Diagnosis not present

## 2019-06-23 DIAGNOSIS — I639 Cerebral infarction, unspecified: Secondary | ICD-10-CM | POA: Diagnosis not present

## 2019-06-23 DIAGNOSIS — R110 Nausea: Secondary | ICD-10-CM | POA: Diagnosis not present

## 2019-06-23 DIAGNOSIS — I890 Lymphedema, not elsewhere classified: Secondary | ICD-10-CM | POA: Diagnosis not present

## 2019-06-23 DIAGNOSIS — G4700 Insomnia, unspecified: Secondary | ICD-10-CM | POA: Diagnosis not present

## 2019-06-23 DIAGNOSIS — I10 Essential (primary) hypertension: Secondary | ICD-10-CM | POA: Diagnosis not present

## 2019-06-23 DIAGNOSIS — G629 Polyneuropathy, unspecified: Secondary | ICD-10-CM | POA: Diagnosis not present

## 2019-06-23 DIAGNOSIS — R58 Hemorrhage, not elsewhere classified: Secondary | ICD-10-CM | POA: Diagnosis not present

## 2019-06-23 DIAGNOSIS — N400 Enlarged prostate without lower urinary tract symptoms: Secondary | ICD-10-CM | POA: Diagnosis not present

## 2019-08-18 DIAGNOSIS — G4700 Insomnia, unspecified: Secondary | ICD-10-CM | POA: Diagnosis not present

## 2019-08-21 DIAGNOSIS — E1140 Type 2 diabetes mellitus with diabetic neuropathy, unspecified: Secondary | ICD-10-CM | POA: Diagnosis not present

## 2019-08-21 DIAGNOSIS — I890 Lymphedema, not elsewhere classified: Secondary | ICD-10-CM | POA: Diagnosis not present

## 2019-08-21 DIAGNOSIS — I679 Cerebrovascular disease, unspecified: Secondary | ICD-10-CM | POA: Diagnosis not present

## 2019-08-21 DIAGNOSIS — N400 Enlarged prostate without lower urinary tract symptoms: Secondary | ICD-10-CM | POA: Diagnosis not present

## 2019-08-21 DIAGNOSIS — I10 Essential (primary) hypertension: Secondary | ICD-10-CM | POA: Diagnosis not present

## 2019-09-22 DIAGNOSIS — I10 Essential (primary) hypertension: Secondary | ICD-10-CM | POA: Diagnosis not present

## 2019-10-28 DIAGNOSIS — M24541 Contracture, right hand: Secondary | ICD-10-CM | POA: Diagnosis not present

## 2019-10-28 DIAGNOSIS — I69351 Hemiplegia and hemiparesis following cerebral infarction affecting right dominant side: Secondary | ICD-10-CM | POA: Diagnosis not present

## 2019-10-28 DIAGNOSIS — R278 Other lack of coordination: Secondary | ICD-10-CM | POA: Diagnosis not present

## 2019-10-28 DIAGNOSIS — R2689 Other abnormalities of gait and mobility: Secondary | ICD-10-CM | POA: Diagnosis not present

## 2019-10-28 DIAGNOSIS — M6281 Muscle weakness (generalized): Secondary | ICD-10-CM | POA: Diagnosis not present

## 2019-10-28 DIAGNOSIS — I872 Venous insufficiency (chronic) (peripheral): Secondary | ICD-10-CM | POA: Diagnosis not present

## 2019-10-28 DIAGNOSIS — I69320 Aphasia following cerebral infarction: Secondary | ICD-10-CM | POA: Diagnosis not present

## 2019-10-30 DIAGNOSIS — I69320 Aphasia following cerebral infarction: Secondary | ICD-10-CM | POA: Diagnosis not present

## 2019-10-30 DIAGNOSIS — R278 Other lack of coordination: Secondary | ICD-10-CM | POA: Diagnosis not present

## 2019-10-30 DIAGNOSIS — M6281 Muscle weakness (generalized): Secondary | ICD-10-CM | POA: Diagnosis not present

## 2019-10-30 DIAGNOSIS — I872 Venous insufficiency (chronic) (peripheral): Secondary | ICD-10-CM | POA: Diagnosis not present

## 2019-10-30 DIAGNOSIS — I69351 Hemiplegia and hemiparesis following cerebral infarction affecting right dominant side: Secondary | ICD-10-CM | POA: Diagnosis not present

## 2019-10-30 DIAGNOSIS — R2689 Other abnormalities of gait and mobility: Secondary | ICD-10-CM | POA: Diagnosis not present

## 2019-11-02 DIAGNOSIS — I872 Venous insufficiency (chronic) (peripheral): Secondary | ICD-10-CM | POA: Diagnosis not present

## 2019-11-02 DIAGNOSIS — I69351 Hemiplegia and hemiparesis following cerebral infarction affecting right dominant side: Secondary | ICD-10-CM | POA: Diagnosis not present

## 2019-11-02 DIAGNOSIS — R278 Other lack of coordination: Secondary | ICD-10-CM | POA: Diagnosis not present

## 2019-11-02 DIAGNOSIS — R2689 Other abnormalities of gait and mobility: Secondary | ICD-10-CM | POA: Diagnosis not present

## 2019-11-02 DIAGNOSIS — I69320 Aphasia following cerebral infarction: Secondary | ICD-10-CM | POA: Diagnosis not present

## 2019-11-02 DIAGNOSIS — M6281 Muscle weakness (generalized): Secondary | ICD-10-CM | POA: Diagnosis not present

## 2019-11-04 DIAGNOSIS — R2689 Other abnormalities of gait and mobility: Secondary | ICD-10-CM | POA: Diagnosis not present

## 2019-11-04 DIAGNOSIS — I69351 Hemiplegia and hemiparesis following cerebral infarction affecting right dominant side: Secondary | ICD-10-CM | POA: Diagnosis not present

## 2019-11-04 DIAGNOSIS — I872 Venous insufficiency (chronic) (peripheral): Secondary | ICD-10-CM | POA: Diagnosis not present

## 2019-11-04 DIAGNOSIS — I69320 Aphasia following cerebral infarction: Secondary | ICD-10-CM | POA: Diagnosis not present

## 2019-11-04 DIAGNOSIS — M6281 Muscle weakness (generalized): Secondary | ICD-10-CM | POA: Diagnosis not present

## 2019-11-04 DIAGNOSIS — R278 Other lack of coordination: Secondary | ICD-10-CM | POA: Diagnosis not present

## 2019-11-06 DIAGNOSIS — N400 Enlarged prostate without lower urinary tract symptoms: Secondary | ICD-10-CM | POA: Diagnosis not present

## 2019-11-06 DIAGNOSIS — G4700 Insomnia, unspecified: Secondary | ICD-10-CM | POA: Diagnosis not present

## 2019-11-06 DIAGNOSIS — I639 Cerebral infarction, unspecified: Secondary | ICD-10-CM | POA: Diagnosis not present

## 2019-11-06 DIAGNOSIS — I890 Lymphedema, not elsewhere classified: Secondary | ICD-10-CM | POA: Diagnosis not present

## 2019-11-06 DIAGNOSIS — J310 Chronic rhinitis: Secondary | ICD-10-CM | POA: Diagnosis not present

## 2019-11-06 DIAGNOSIS — I10 Essential (primary) hypertension: Secondary | ICD-10-CM | POA: Diagnosis not present

## 2019-11-06 DIAGNOSIS — E119 Type 2 diabetes mellitus without complications: Secondary | ICD-10-CM | POA: Diagnosis not present

## 2019-11-06 DIAGNOSIS — G629 Polyneuropathy, unspecified: Secondary | ICD-10-CM | POA: Diagnosis not present

## 2019-11-09 DIAGNOSIS — R278 Other lack of coordination: Secondary | ICD-10-CM | POA: Diagnosis not present

## 2019-11-09 DIAGNOSIS — I69320 Aphasia following cerebral infarction: Secondary | ICD-10-CM | POA: Diagnosis not present

## 2019-11-09 DIAGNOSIS — M6281 Muscle weakness (generalized): Secondary | ICD-10-CM | POA: Diagnosis not present

## 2019-11-09 DIAGNOSIS — I872 Venous insufficiency (chronic) (peripheral): Secondary | ICD-10-CM | POA: Diagnosis not present

## 2019-11-09 DIAGNOSIS — I69351 Hemiplegia and hemiparesis following cerebral infarction affecting right dominant side: Secondary | ICD-10-CM | POA: Diagnosis not present

## 2019-11-09 DIAGNOSIS — R2689 Other abnormalities of gait and mobility: Secondary | ICD-10-CM | POA: Diagnosis not present

## 2019-11-10 DIAGNOSIS — I69351 Hemiplegia and hemiparesis following cerebral infarction affecting right dominant side: Secondary | ICD-10-CM | POA: Diagnosis not present

## 2019-11-10 DIAGNOSIS — R2689 Other abnormalities of gait and mobility: Secondary | ICD-10-CM | POA: Diagnosis not present

## 2019-11-10 DIAGNOSIS — M6281 Muscle weakness (generalized): Secondary | ICD-10-CM | POA: Diagnosis not present

## 2019-11-10 DIAGNOSIS — I69320 Aphasia following cerebral infarction: Secondary | ICD-10-CM | POA: Diagnosis not present

## 2019-11-10 DIAGNOSIS — I872 Venous insufficiency (chronic) (peripheral): Secondary | ICD-10-CM | POA: Diagnosis not present

## 2019-11-10 DIAGNOSIS — R278 Other lack of coordination: Secondary | ICD-10-CM | POA: Diagnosis not present

## 2019-11-11 DIAGNOSIS — I872 Venous insufficiency (chronic) (peripheral): Secondary | ICD-10-CM | POA: Diagnosis not present

## 2019-11-11 DIAGNOSIS — M24541 Contracture, right hand: Secondary | ICD-10-CM | POA: Diagnosis not present

## 2019-11-11 DIAGNOSIS — M6281 Muscle weakness (generalized): Secondary | ICD-10-CM | POA: Diagnosis not present

## 2019-11-11 DIAGNOSIS — R278 Other lack of coordination: Secondary | ICD-10-CM | POA: Diagnosis not present

## 2019-11-11 DIAGNOSIS — R2689 Other abnormalities of gait and mobility: Secondary | ICD-10-CM | POA: Diagnosis not present

## 2019-11-11 DIAGNOSIS — I69351 Hemiplegia and hemiparesis following cerebral infarction affecting right dominant side: Secondary | ICD-10-CM | POA: Diagnosis not present

## 2019-11-11 DIAGNOSIS — L309 Dermatitis, unspecified: Secondary | ICD-10-CM | POA: Diagnosis not present

## 2019-11-11 DIAGNOSIS — I69320 Aphasia following cerebral infarction: Secondary | ICD-10-CM | POA: Diagnosis not present

## 2019-11-12 DIAGNOSIS — I872 Venous insufficiency (chronic) (peripheral): Secondary | ICD-10-CM | POA: Diagnosis not present

## 2019-11-12 DIAGNOSIS — R2689 Other abnormalities of gait and mobility: Secondary | ICD-10-CM | POA: Diagnosis not present

## 2019-11-12 DIAGNOSIS — I69320 Aphasia following cerebral infarction: Secondary | ICD-10-CM | POA: Diagnosis not present

## 2019-11-12 DIAGNOSIS — R278 Other lack of coordination: Secondary | ICD-10-CM | POA: Diagnosis not present

## 2019-11-12 DIAGNOSIS — M6281 Muscle weakness (generalized): Secondary | ICD-10-CM | POA: Diagnosis not present

## 2019-11-12 DIAGNOSIS — I69351 Hemiplegia and hemiparesis following cerebral infarction affecting right dominant side: Secondary | ICD-10-CM | POA: Diagnosis not present

## 2019-11-13 DIAGNOSIS — R278 Other lack of coordination: Secondary | ICD-10-CM | POA: Diagnosis not present

## 2019-11-13 DIAGNOSIS — R2689 Other abnormalities of gait and mobility: Secondary | ICD-10-CM | POA: Diagnosis not present

## 2019-11-13 DIAGNOSIS — I69320 Aphasia following cerebral infarction: Secondary | ICD-10-CM | POA: Diagnosis not present

## 2019-11-13 DIAGNOSIS — I69351 Hemiplegia and hemiparesis following cerebral infarction affecting right dominant side: Secondary | ICD-10-CM | POA: Diagnosis not present

## 2019-11-13 DIAGNOSIS — M6281 Muscle weakness (generalized): Secondary | ICD-10-CM | POA: Diagnosis not present

## 2019-11-13 DIAGNOSIS — I872 Venous insufficiency (chronic) (peripheral): Secondary | ICD-10-CM | POA: Diagnosis not present

## 2019-11-17 DIAGNOSIS — I872 Venous insufficiency (chronic) (peripheral): Secondary | ICD-10-CM | POA: Diagnosis not present

## 2019-11-17 DIAGNOSIS — R2689 Other abnormalities of gait and mobility: Secondary | ICD-10-CM | POA: Diagnosis not present

## 2019-11-17 DIAGNOSIS — M6281 Muscle weakness (generalized): Secondary | ICD-10-CM | POA: Diagnosis not present

## 2019-11-17 DIAGNOSIS — I69320 Aphasia following cerebral infarction: Secondary | ICD-10-CM | POA: Diagnosis not present

## 2019-11-17 DIAGNOSIS — I69351 Hemiplegia and hemiparesis following cerebral infarction affecting right dominant side: Secondary | ICD-10-CM | POA: Diagnosis not present

## 2019-11-17 DIAGNOSIS — R278 Other lack of coordination: Secondary | ICD-10-CM | POA: Diagnosis not present

## 2019-11-18 DIAGNOSIS — R278 Other lack of coordination: Secondary | ICD-10-CM | POA: Diagnosis not present

## 2019-11-18 DIAGNOSIS — I69320 Aphasia following cerebral infarction: Secondary | ICD-10-CM | POA: Diagnosis not present

## 2019-11-18 DIAGNOSIS — I69351 Hemiplegia and hemiparesis following cerebral infarction affecting right dominant side: Secondary | ICD-10-CM | POA: Diagnosis not present

## 2019-11-18 DIAGNOSIS — I872 Venous insufficiency (chronic) (peripheral): Secondary | ICD-10-CM | POA: Diagnosis not present

## 2019-11-18 DIAGNOSIS — R2689 Other abnormalities of gait and mobility: Secondary | ICD-10-CM | POA: Diagnosis not present

## 2019-11-18 DIAGNOSIS — M6281 Muscle weakness (generalized): Secondary | ICD-10-CM | POA: Diagnosis not present

## 2019-11-20 DIAGNOSIS — R2689 Other abnormalities of gait and mobility: Secondary | ICD-10-CM | POA: Diagnosis not present

## 2019-11-20 DIAGNOSIS — M6281 Muscle weakness (generalized): Secondary | ICD-10-CM | POA: Diagnosis not present

## 2019-11-20 DIAGNOSIS — I872 Venous insufficiency (chronic) (peripheral): Secondary | ICD-10-CM | POA: Diagnosis not present

## 2019-11-20 DIAGNOSIS — I69320 Aphasia following cerebral infarction: Secondary | ICD-10-CM | POA: Diagnosis not present

## 2019-11-20 DIAGNOSIS — R278 Other lack of coordination: Secondary | ICD-10-CM | POA: Diagnosis not present

## 2019-11-20 DIAGNOSIS — I69351 Hemiplegia and hemiparesis following cerebral infarction affecting right dominant side: Secondary | ICD-10-CM | POA: Diagnosis not present

## 2019-11-23 DIAGNOSIS — I872 Venous insufficiency (chronic) (peripheral): Secondary | ICD-10-CM | POA: Diagnosis not present

## 2019-11-23 DIAGNOSIS — I69320 Aphasia following cerebral infarction: Secondary | ICD-10-CM | POA: Diagnosis not present

## 2019-11-23 DIAGNOSIS — R2689 Other abnormalities of gait and mobility: Secondary | ICD-10-CM | POA: Diagnosis not present

## 2019-11-23 DIAGNOSIS — R278 Other lack of coordination: Secondary | ICD-10-CM | POA: Diagnosis not present

## 2019-11-23 DIAGNOSIS — I69351 Hemiplegia and hemiparesis following cerebral infarction affecting right dominant side: Secondary | ICD-10-CM | POA: Diagnosis not present

## 2019-11-23 DIAGNOSIS — M6281 Muscle weakness (generalized): Secondary | ICD-10-CM | POA: Diagnosis not present

## 2019-11-25 DIAGNOSIS — M6281 Muscle weakness (generalized): Secondary | ICD-10-CM | POA: Diagnosis not present

## 2019-11-25 DIAGNOSIS — R278 Other lack of coordination: Secondary | ICD-10-CM | POA: Diagnosis not present

## 2019-11-25 DIAGNOSIS — I69320 Aphasia following cerebral infarction: Secondary | ICD-10-CM | POA: Diagnosis not present

## 2019-11-25 DIAGNOSIS — I69351 Hemiplegia and hemiparesis following cerebral infarction affecting right dominant side: Secondary | ICD-10-CM | POA: Diagnosis not present

## 2019-11-25 DIAGNOSIS — I872 Venous insufficiency (chronic) (peripheral): Secondary | ICD-10-CM | POA: Diagnosis not present

## 2019-11-25 DIAGNOSIS — R2689 Other abnormalities of gait and mobility: Secondary | ICD-10-CM | POA: Diagnosis not present

## 2019-11-27 DIAGNOSIS — R2689 Other abnormalities of gait and mobility: Secondary | ICD-10-CM | POA: Diagnosis not present

## 2019-11-27 DIAGNOSIS — I69351 Hemiplegia and hemiparesis following cerebral infarction affecting right dominant side: Secondary | ICD-10-CM | POA: Diagnosis not present

## 2019-11-27 DIAGNOSIS — I69320 Aphasia following cerebral infarction: Secondary | ICD-10-CM | POA: Diagnosis not present

## 2019-11-27 DIAGNOSIS — R278 Other lack of coordination: Secondary | ICD-10-CM | POA: Diagnosis not present

## 2019-11-27 DIAGNOSIS — I872 Venous insufficiency (chronic) (peripheral): Secondary | ICD-10-CM | POA: Diagnosis not present

## 2019-11-27 DIAGNOSIS — M6281 Muscle weakness (generalized): Secondary | ICD-10-CM | POA: Diagnosis not present

## 2019-11-30 DIAGNOSIS — I69351 Hemiplegia and hemiparesis following cerebral infarction affecting right dominant side: Secondary | ICD-10-CM | POA: Diagnosis not present

## 2019-11-30 DIAGNOSIS — N390 Urinary tract infection, site not specified: Secondary | ICD-10-CM | POA: Diagnosis not present

## 2019-11-30 DIAGNOSIS — R278 Other lack of coordination: Secondary | ICD-10-CM | POA: Diagnosis not present

## 2019-11-30 DIAGNOSIS — R2689 Other abnormalities of gait and mobility: Secondary | ICD-10-CM | POA: Diagnosis not present

## 2019-11-30 DIAGNOSIS — I69320 Aphasia following cerebral infarction: Secondary | ICD-10-CM | POA: Diagnosis not present

## 2019-11-30 DIAGNOSIS — I872 Venous insufficiency (chronic) (peripheral): Secondary | ICD-10-CM | POA: Diagnosis not present

## 2019-11-30 DIAGNOSIS — M6281 Muscle weakness (generalized): Secondary | ICD-10-CM | POA: Diagnosis not present

## 2019-12-01 DIAGNOSIS — I890 Lymphedema, not elsewhere classified: Secondary | ICD-10-CM | POA: Diagnosis not present

## 2019-12-01 DIAGNOSIS — E785 Hyperlipidemia, unspecified: Secondary | ICD-10-CM | POA: Diagnosis not present

## 2019-12-01 DIAGNOSIS — E119 Type 2 diabetes mellitus without complications: Secondary | ICD-10-CM | POA: Diagnosis not present

## 2019-12-01 DIAGNOSIS — R300 Dysuria: Secondary | ICD-10-CM | POA: Diagnosis not present

## 2019-12-01 DIAGNOSIS — Z7984 Long term (current) use of oral hypoglycemic drugs: Secondary | ICD-10-CM | POA: Diagnosis not present

## 2019-12-01 DIAGNOSIS — E1143 Type 2 diabetes mellitus with diabetic autonomic (poly)neuropathy: Secondary | ICD-10-CM | POA: Diagnosis not present

## 2019-12-01 DIAGNOSIS — G464 Cerebellar stroke syndrome: Secondary | ICD-10-CM | POA: Diagnosis not present

## 2019-12-03 DIAGNOSIS — I69320 Aphasia following cerebral infarction: Secondary | ICD-10-CM | POA: Diagnosis not present

## 2019-12-03 DIAGNOSIS — R278 Other lack of coordination: Secondary | ICD-10-CM | POA: Diagnosis not present

## 2019-12-03 DIAGNOSIS — I872 Venous insufficiency (chronic) (peripheral): Secondary | ICD-10-CM | POA: Diagnosis not present

## 2019-12-03 DIAGNOSIS — M6281 Muscle weakness (generalized): Secondary | ICD-10-CM | POA: Diagnosis not present

## 2019-12-03 DIAGNOSIS — R2689 Other abnormalities of gait and mobility: Secondary | ICD-10-CM | POA: Diagnosis not present

## 2019-12-03 DIAGNOSIS — I69351 Hemiplegia and hemiparesis following cerebral infarction affecting right dominant side: Secondary | ICD-10-CM | POA: Diagnosis not present

## 2019-12-07 DIAGNOSIS — M6281 Muscle weakness (generalized): Secondary | ICD-10-CM | POA: Diagnosis not present

## 2019-12-07 DIAGNOSIS — R278 Other lack of coordination: Secondary | ICD-10-CM | POA: Diagnosis not present

## 2019-12-07 DIAGNOSIS — I872 Venous insufficiency (chronic) (peripheral): Secondary | ICD-10-CM | POA: Diagnosis not present

## 2019-12-07 DIAGNOSIS — I69320 Aphasia following cerebral infarction: Secondary | ICD-10-CM | POA: Diagnosis not present

## 2019-12-07 DIAGNOSIS — R2689 Other abnormalities of gait and mobility: Secondary | ICD-10-CM | POA: Diagnosis not present

## 2019-12-07 DIAGNOSIS — I69351 Hemiplegia and hemiparesis following cerebral infarction affecting right dominant side: Secondary | ICD-10-CM | POA: Diagnosis not present

## 2019-12-08 DIAGNOSIS — M6281 Muscle weakness (generalized): Secondary | ICD-10-CM | POA: Diagnosis not present

## 2019-12-08 DIAGNOSIS — R278 Other lack of coordination: Secondary | ICD-10-CM | POA: Diagnosis not present

## 2019-12-08 DIAGNOSIS — I69351 Hemiplegia and hemiparesis following cerebral infarction affecting right dominant side: Secondary | ICD-10-CM | POA: Diagnosis not present

## 2019-12-08 DIAGNOSIS — I872 Venous insufficiency (chronic) (peripheral): Secondary | ICD-10-CM | POA: Diagnosis not present

## 2019-12-08 DIAGNOSIS — R2689 Other abnormalities of gait and mobility: Secondary | ICD-10-CM | POA: Diagnosis not present

## 2019-12-08 DIAGNOSIS — I69320 Aphasia following cerebral infarction: Secondary | ICD-10-CM | POA: Diagnosis not present

## 2019-12-10 DIAGNOSIS — I69351 Hemiplegia and hemiparesis following cerebral infarction affecting right dominant side: Secondary | ICD-10-CM | POA: Diagnosis not present

## 2019-12-10 DIAGNOSIS — I872 Venous insufficiency (chronic) (peripheral): Secondary | ICD-10-CM | POA: Diagnosis not present

## 2019-12-10 DIAGNOSIS — M6281 Muscle weakness (generalized): Secondary | ICD-10-CM | POA: Diagnosis not present

## 2019-12-10 DIAGNOSIS — R278 Other lack of coordination: Secondary | ICD-10-CM | POA: Diagnosis not present

## 2019-12-10 DIAGNOSIS — R2689 Other abnormalities of gait and mobility: Secondary | ICD-10-CM | POA: Diagnosis not present

## 2019-12-10 DIAGNOSIS — I69320 Aphasia following cerebral infarction: Secondary | ICD-10-CM | POA: Diagnosis not present

## 2019-12-14 DIAGNOSIS — R278 Other lack of coordination: Secondary | ICD-10-CM | POA: Diagnosis not present

## 2019-12-14 DIAGNOSIS — M6281 Muscle weakness (generalized): Secondary | ICD-10-CM | POA: Diagnosis not present

## 2019-12-14 DIAGNOSIS — R2689 Other abnormalities of gait and mobility: Secondary | ICD-10-CM | POA: Diagnosis not present

## 2019-12-14 DIAGNOSIS — I69351 Hemiplegia and hemiparesis following cerebral infarction affecting right dominant side: Secondary | ICD-10-CM | POA: Diagnosis not present

## 2019-12-14 DIAGNOSIS — I872 Venous insufficiency (chronic) (peripheral): Secondary | ICD-10-CM | POA: Diagnosis not present

## 2019-12-14 DIAGNOSIS — I69320 Aphasia following cerebral infarction: Secondary | ICD-10-CM | POA: Diagnosis not present

## 2019-12-17 DIAGNOSIS — Z23 Encounter for immunization: Secondary | ICD-10-CM | POA: Diagnosis not present

## 2019-12-21 DIAGNOSIS — I890 Lymphedema, not elsewhere classified: Secondary | ICD-10-CM | POA: Diagnosis not present

## 2019-12-21 DIAGNOSIS — I10 Essential (primary) hypertension: Secondary | ICD-10-CM | POA: Diagnosis not present

## 2019-12-21 DIAGNOSIS — J309 Allergic rhinitis, unspecified: Secondary | ICD-10-CM | POA: Diagnosis not present

## 2019-12-21 DIAGNOSIS — I69320 Aphasia following cerebral infarction: Secondary | ICD-10-CM | POA: Diagnosis not present

## 2019-12-21 DIAGNOSIS — I639 Cerebral infarction, unspecified: Secondary | ICD-10-CM | POA: Diagnosis not present

## 2019-12-29 DIAGNOSIS — G4700 Insomnia, unspecified: Secondary | ICD-10-CM | POA: Diagnosis not present

## 2020-01-28 DIAGNOSIS — R300 Dysuria: Secondary | ICD-10-CM | POA: Diagnosis not present

## 2020-01-28 DIAGNOSIS — I890 Lymphedema, not elsewhere classified: Secondary | ICD-10-CM | POA: Diagnosis not present

## 2020-01-28 DIAGNOSIS — G464 Cerebellar stroke syndrome: Secondary | ICD-10-CM | POA: Diagnosis not present

## 2020-01-28 DIAGNOSIS — I10 Essential (primary) hypertension: Secondary | ICD-10-CM | POA: Diagnosis not present

## 2020-01-28 DIAGNOSIS — E1143 Type 2 diabetes mellitus with diabetic autonomic (poly)neuropathy: Secondary | ICD-10-CM | POA: Diagnosis not present

## 2020-01-28 DIAGNOSIS — Z7984 Long term (current) use of oral hypoglycemic drugs: Secondary | ICD-10-CM | POA: Diagnosis not present

## 2020-01-28 DIAGNOSIS — E785 Hyperlipidemia, unspecified: Secondary | ICD-10-CM | POA: Diagnosis not present

## 2020-03-29 DIAGNOSIS — G4700 Insomnia, unspecified: Secondary | ICD-10-CM | POA: Diagnosis not present

## 2020-04-19 DIAGNOSIS — I10 Essential (primary) hypertension: Secondary | ICD-10-CM | POA: Diagnosis not present

## 2020-04-19 DIAGNOSIS — D649 Anemia, unspecified: Secondary | ICD-10-CM | POA: Diagnosis not present

## 2020-04-19 DIAGNOSIS — R1011 Right upper quadrant pain: Secondary | ICD-10-CM | POA: Diagnosis not present

## 2020-04-19 DIAGNOSIS — K5900 Constipation, unspecified: Secondary | ICD-10-CM | POA: Diagnosis not present

## 2020-04-25 DIAGNOSIS — E119 Type 2 diabetes mellitus without complications: Secondary | ICD-10-CM | POA: Diagnosis not present

## 2020-04-25 DIAGNOSIS — N400 Enlarged prostate without lower urinary tract symptoms: Secondary | ICD-10-CM | POA: Diagnosis not present

## 2020-04-25 DIAGNOSIS — I890 Lymphedema, not elsewhere classified: Secondary | ICD-10-CM | POA: Diagnosis not present

## 2020-04-25 DIAGNOSIS — I10 Essential (primary) hypertension: Secondary | ICD-10-CM | POA: Diagnosis not present

## 2020-04-25 DIAGNOSIS — I679 Cerebrovascular disease, unspecified: Secondary | ICD-10-CM | POA: Diagnosis not present

## 2020-04-25 DIAGNOSIS — G629 Polyneuropathy, unspecified: Secondary | ICD-10-CM | POA: Diagnosis not present

## 2020-05-25 DIAGNOSIS — Z7984 Long term (current) use of oral hypoglycemic drugs: Secondary | ICD-10-CM | POA: Diagnosis not present

## 2020-05-25 DIAGNOSIS — E1143 Type 2 diabetes mellitus with diabetic autonomic (poly)neuropathy: Secondary | ICD-10-CM | POA: Diagnosis not present

## 2020-06-01 DIAGNOSIS — I10 Essential (primary) hypertension: Secondary | ICD-10-CM | POA: Diagnosis not present

## 2020-06-14 DIAGNOSIS — R059 Cough, unspecified: Secondary | ICD-10-CM | POA: Diagnosis not present

## 2020-06-14 DIAGNOSIS — J439 Emphysema, unspecified: Secondary | ICD-10-CM | POA: Diagnosis not present

## 2020-06-14 DIAGNOSIS — J3489 Other specified disorders of nose and nasal sinuses: Secondary | ICD-10-CM | POA: Diagnosis not present

## 2020-06-14 DIAGNOSIS — R0989 Other specified symptoms and signs involving the circulatory and respiratory systems: Secondary | ICD-10-CM | POA: Diagnosis not present

## 2020-06-15 DIAGNOSIS — D649 Anemia, unspecified: Secondary | ICD-10-CM | POA: Diagnosis not present

## 2020-06-15 DIAGNOSIS — I10 Essential (primary) hypertension: Secondary | ICD-10-CM | POA: Diagnosis not present

## 2020-06-28 DIAGNOSIS — G4700 Insomnia, unspecified: Secondary | ICD-10-CM | POA: Diagnosis not present

## 2020-06-29 DIAGNOSIS — Z23 Encounter for immunization: Secondary | ICD-10-CM | POA: Diagnosis not present

## 2020-07-04 DIAGNOSIS — R0981 Nasal congestion: Secondary | ICD-10-CM | POA: Diagnosis not present

## 2020-07-04 DIAGNOSIS — T7840XD Allergy, unspecified, subsequent encounter: Secondary | ICD-10-CM | POA: Diagnosis not present

## 2020-08-26 DIAGNOSIS — N400 Enlarged prostate without lower urinary tract symptoms: Secondary | ICD-10-CM | POA: Diagnosis not present

## 2020-08-26 DIAGNOSIS — I69320 Aphasia following cerebral infarction: Secondary | ICD-10-CM | POA: Diagnosis not present

## 2020-08-26 DIAGNOSIS — I69351 Hemiplegia and hemiparesis following cerebral infarction affecting right dominant side: Secondary | ICD-10-CM | POA: Diagnosis not present

## 2020-08-26 DIAGNOSIS — I679 Cerebrovascular disease, unspecified: Secondary | ICD-10-CM | POA: Diagnosis not present

## 2020-08-26 DIAGNOSIS — I890 Lymphedema, not elsewhere classified: Secondary | ICD-10-CM | POA: Diagnosis not present

## 2020-08-26 DIAGNOSIS — E119 Type 2 diabetes mellitus without complications: Secondary | ICD-10-CM | POA: Diagnosis not present

## 2020-08-26 DIAGNOSIS — I10 Essential (primary) hypertension: Secondary | ICD-10-CM | POA: Diagnosis not present

## 2020-08-29 DIAGNOSIS — R0989 Other specified symptoms and signs involving the circulatory and respiratory systems: Secondary | ICD-10-CM | POA: Diagnosis not present

## 2020-09-20 DIAGNOSIS — F5101 Primary insomnia: Secondary | ICD-10-CM | POA: Diagnosis not present

## 2020-09-21 DIAGNOSIS — R609 Edema, unspecified: Secondary | ICD-10-CM | POA: Diagnosis not present

## 2020-09-21 DIAGNOSIS — R350 Frequency of micturition: Secondary | ICD-10-CM | POA: Diagnosis not present

## 2020-09-22 DIAGNOSIS — R300 Dysuria: Secondary | ICD-10-CM | POA: Diagnosis not present

## 2020-11-01 DIAGNOSIS — N400 Enlarged prostate without lower urinary tract symptoms: Secondary | ICD-10-CM | POA: Diagnosis not present

## 2020-11-01 DIAGNOSIS — I639 Cerebral infarction, unspecified: Secondary | ICD-10-CM | POA: Diagnosis not present

## 2020-11-01 DIAGNOSIS — E119 Type 2 diabetes mellitus without complications: Secondary | ICD-10-CM | POA: Diagnosis not present

## 2020-11-01 DIAGNOSIS — I890 Lymphedema, not elsewhere classified: Secondary | ICD-10-CM | POA: Diagnosis not present

## 2020-11-01 DIAGNOSIS — G4700 Insomnia, unspecified: Secondary | ICD-10-CM | POA: Diagnosis not present

## 2020-11-01 DIAGNOSIS — I10 Essential (primary) hypertension: Secondary | ICD-10-CM | POA: Diagnosis not present

## 2020-11-01 DIAGNOSIS — J310 Chronic rhinitis: Secondary | ICD-10-CM | POA: Diagnosis not present

## 2020-11-01 DIAGNOSIS — G629 Polyneuropathy, unspecified: Secondary | ICD-10-CM | POA: Diagnosis not present

## 2020-11-06 DIAGNOSIS — N5089 Other specified disorders of the male genital organs: Secondary | ICD-10-CM | POA: Diagnosis not present

## 2020-11-06 DIAGNOSIS — E1143 Type 2 diabetes mellitus with diabetic autonomic (poly)neuropathy: Secondary | ICD-10-CM | POA: Diagnosis not present

## 2020-11-06 DIAGNOSIS — G8929 Other chronic pain: Secondary | ICD-10-CM | POA: Diagnosis not present

## 2020-11-06 DIAGNOSIS — J309 Allergic rhinitis, unspecified: Secondary | ICD-10-CM | POA: Diagnosis not present

## 2020-11-06 DIAGNOSIS — Z7984 Long term (current) use of oral hypoglycemic drugs: Secondary | ICD-10-CM | POA: Diagnosis not present

## 2020-11-06 DIAGNOSIS — I69351 Hemiplegia and hemiparesis following cerebral infarction affecting right dominant side: Secondary | ICD-10-CM | POA: Diagnosis not present

## 2020-11-06 DIAGNOSIS — N400 Enlarged prostate without lower urinary tract symptoms: Secondary | ICD-10-CM | POA: Diagnosis not present

## 2020-11-06 DIAGNOSIS — G4700 Insomnia, unspecified: Secondary | ICD-10-CM | POA: Diagnosis not present

## 2020-11-06 DIAGNOSIS — U071 COVID-19: Secondary | ICD-10-CM | POA: Diagnosis not present

## 2020-11-06 DIAGNOSIS — E785 Hyperlipidemia, unspecified: Secondary | ICD-10-CM | POA: Diagnosis not present

## 2020-11-06 DIAGNOSIS — I890 Lymphedema, not elsewhere classified: Secondary | ICD-10-CM | POA: Diagnosis not present

## 2020-11-06 DIAGNOSIS — I69320 Aphasia following cerebral infarction: Secondary | ICD-10-CM | POA: Diagnosis not present

## 2020-11-06 DIAGNOSIS — I10 Essential (primary) hypertension: Secondary | ICD-10-CM | POA: Diagnosis not present

## 2020-11-06 DIAGNOSIS — I872 Venous insufficiency (chronic) (peripheral): Secondary | ICD-10-CM | POA: Diagnosis not present

## 2020-11-07 DIAGNOSIS — U071 COVID-19: Secondary | ICD-10-CM | POA: Diagnosis not present

## 2020-11-21 DIAGNOSIS — E1143 Type 2 diabetes mellitus with diabetic autonomic (poly)neuropathy: Secondary | ICD-10-CM | POA: Diagnosis not present

## 2020-11-21 DIAGNOSIS — I10 Essential (primary) hypertension: Secondary | ICD-10-CM | POA: Diagnosis not present

## 2020-11-21 DIAGNOSIS — E785 Hyperlipidemia, unspecified: Secondary | ICD-10-CM | POA: Diagnosis not present

## 2020-11-21 DIAGNOSIS — Z7984 Long term (current) use of oral hypoglycemic drugs: Secondary | ICD-10-CM | POA: Diagnosis not present

## 2020-12-08 DIAGNOSIS — R059 Cough, unspecified: Secondary | ICD-10-CM | POA: Diagnosis not present

## 2020-12-08 DIAGNOSIS — R5383 Other fatigue: Secondary | ICD-10-CM | POA: Diagnosis not present

## 2020-12-08 DIAGNOSIS — J988 Other specified respiratory disorders: Secondary | ICD-10-CM | POA: Diagnosis not present

## 2020-12-12 DIAGNOSIS — U071 COVID-19: Secondary | ICD-10-CM | POA: Diagnosis not present

## 2020-12-26 DIAGNOSIS — I10 Essential (primary) hypertension: Secondary | ICD-10-CM | POA: Diagnosis not present

## 2020-12-26 DIAGNOSIS — I639 Cerebral infarction, unspecified: Secondary | ICD-10-CM | POA: Diagnosis not present

## 2020-12-26 DIAGNOSIS — N400 Enlarged prostate without lower urinary tract symptoms: Secondary | ICD-10-CM | POA: Diagnosis not present

## 2020-12-26 DIAGNOSIS — E119 Type 2 diabetes mellitus without complications: Secondary | ICD-10-CM | POA: Diagnosis not present

## 2020-12-26 DIAGNOSIS — Z7984 Long term (current) use of oral hypoglycemic drugs: Secondary | ICD-10-CM | POA: Diagnosis not present

## 2020-12-26 DIAGNOSIS — I890 Lymphedema, not elsewhere classified: Secondary | ICD-10-CM | POA: Diagnosis not present

## 2021-01-03 DIAGNOSIS — F5101 Primary insomnia: Secondary | ICD-10-CM | POA: Diagnosis not present

## 2021-01-31 IMAGING — CT CT ABD-PELV W/ CM
2 of 5 series · 16 of 46 positions shown, 18 images · IV contrast (iopamidol)
Comparison: None.

CLINICAL DATA: Acute right lower quadrant abdominal pain with dark
urine for 3 days. No fever or history of malignancy. Previous
cholecystectomy.

EXAM:
CT ABDOMEN AND PELVIS WITH CONTRAST
TECHNIQUE: Multidetector CT imaging of the abdomen and pelvis was performed
using the standard protocol following bolus administration of
intravenous contrast.
CONTRAST:  100mL 9MY1N2-DAA IOPAMIDOL (9MY1N2-DAA) INJECTION 61%

[Series 2: abd pelvis 5.00 br40 s3 ax · axial · 0.95mm/px · z∈[+1451,+1861]mm · 13 of 92 slices shown, 15 images]
[im 5/92  soft-tissue]
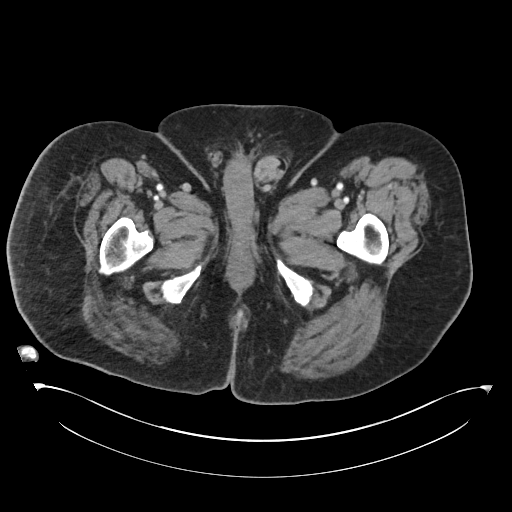
[im 5/92  bone]
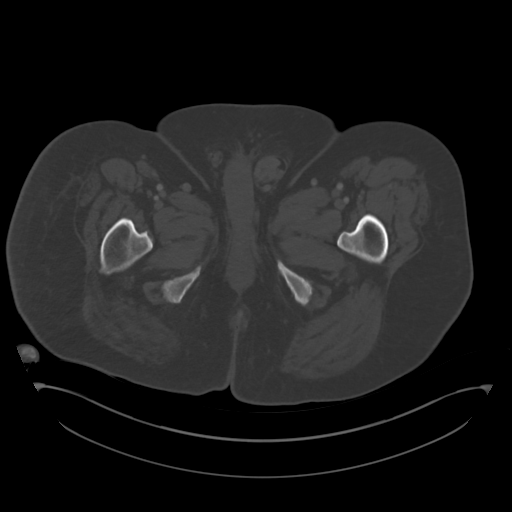
[im 15/92  soft-tissue]
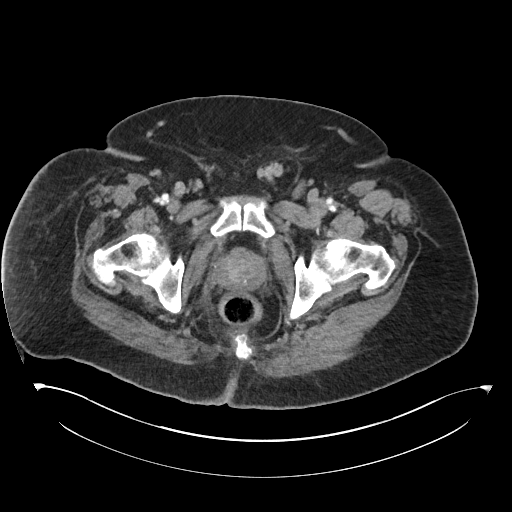
[im 20/92  soft-tissue]
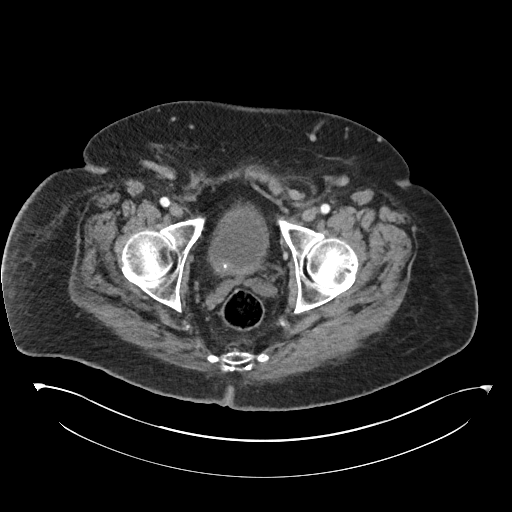
[im 24/92  soft-tissue]
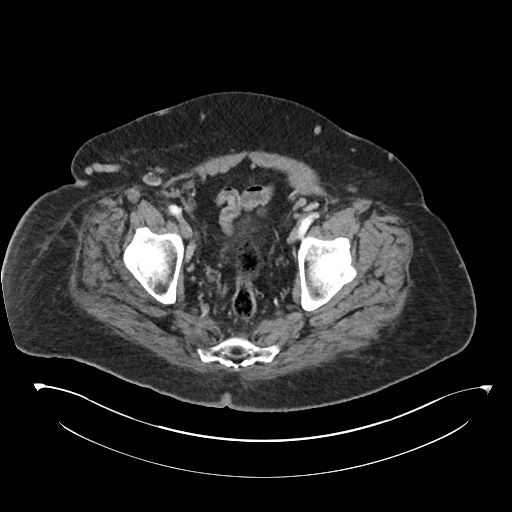
[im 34/92  soft-tissue]
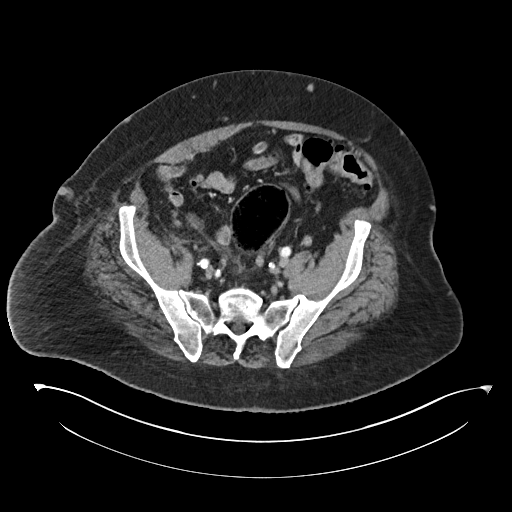
[im 39/92  soft-tissue]
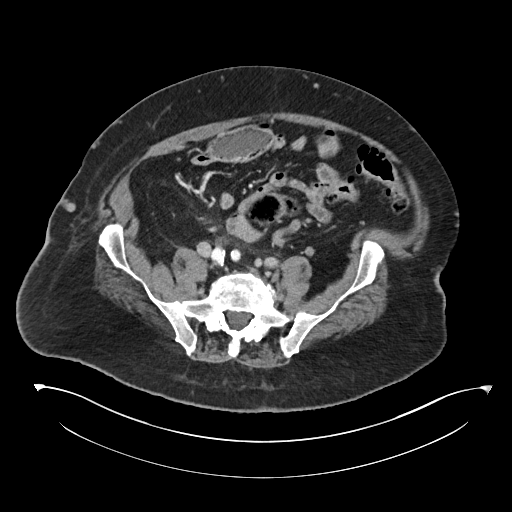
[im 48/92  soft-tissue]
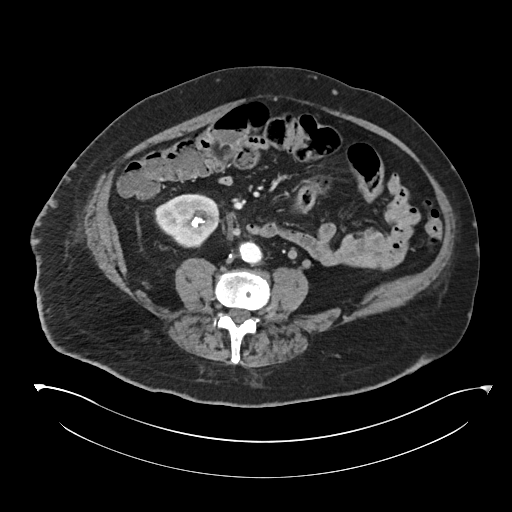
[im 53/92  soft-tissue]
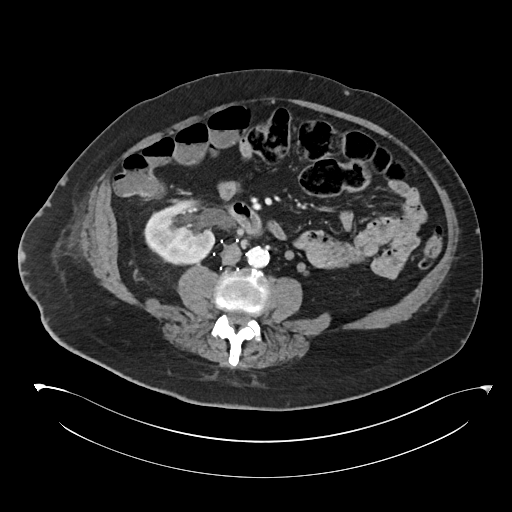
[im 58/92  soft-tissue]
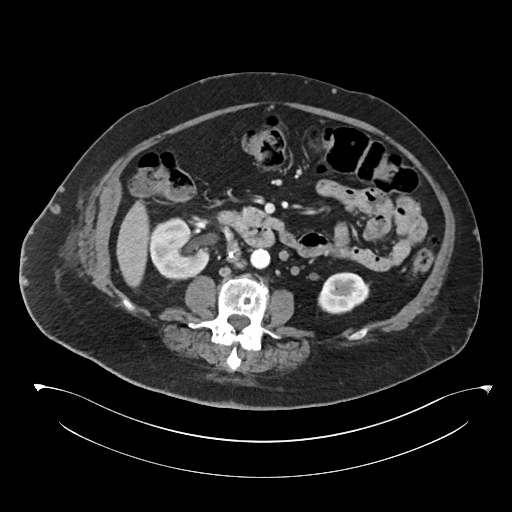
[im 58/92  bone]
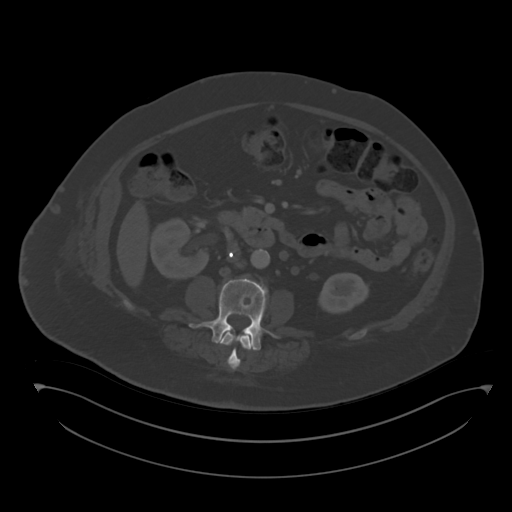
[im 68/92  soft-tissue]
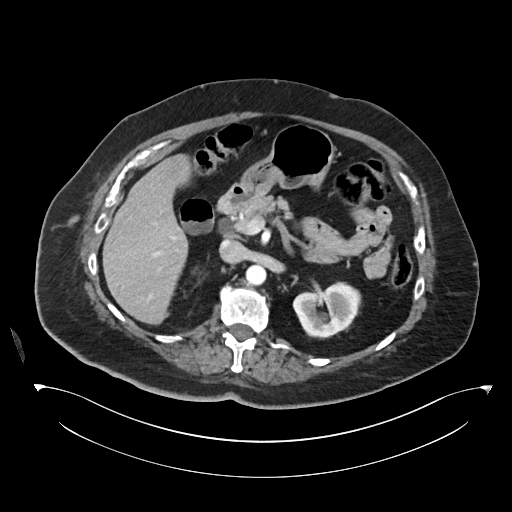
[im 72/92  soft-tissue]
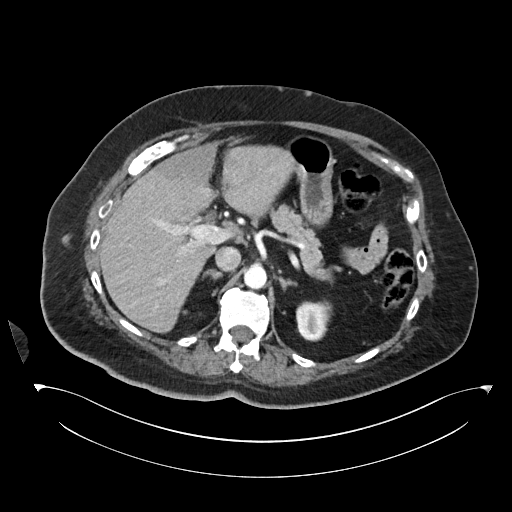
[im 77/92  soft-tissue]
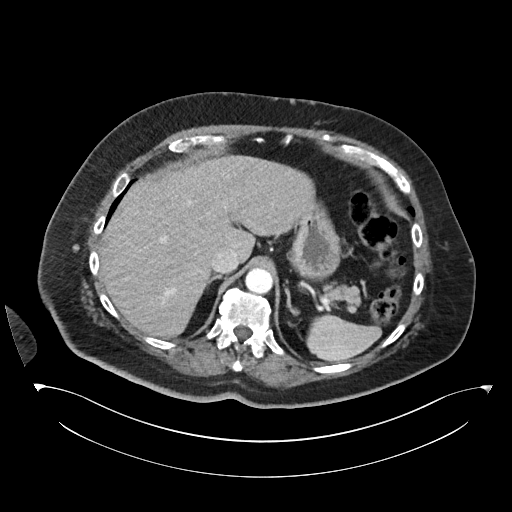
[im 87/92  soft-tissue]
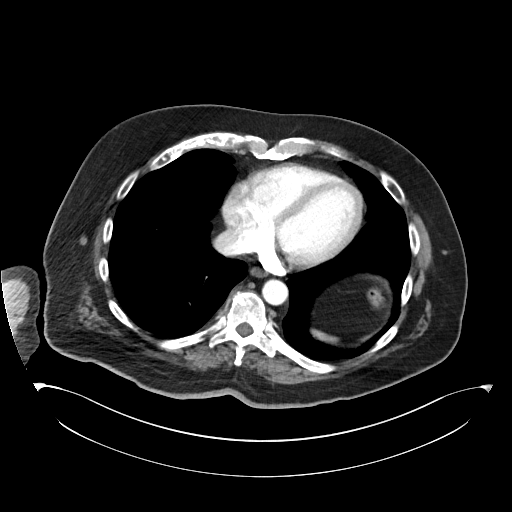

[Series 6: abd pelvis 2.00 br40 s3 cor · coronal · 0.91mm/px · 3 of 242 slices shown]
[im 81/242  soft-tissue]
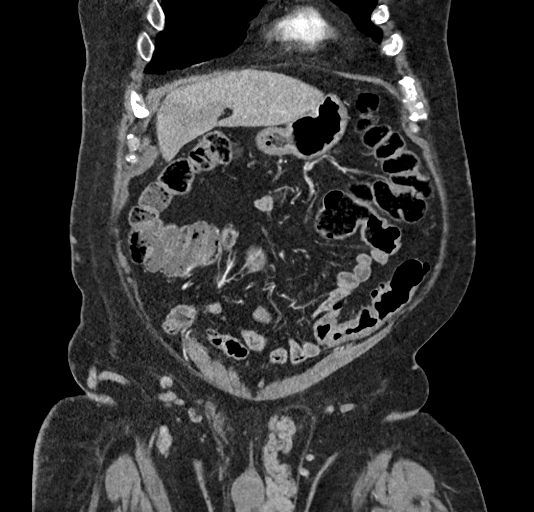
[im 108/242  soft-tissue]
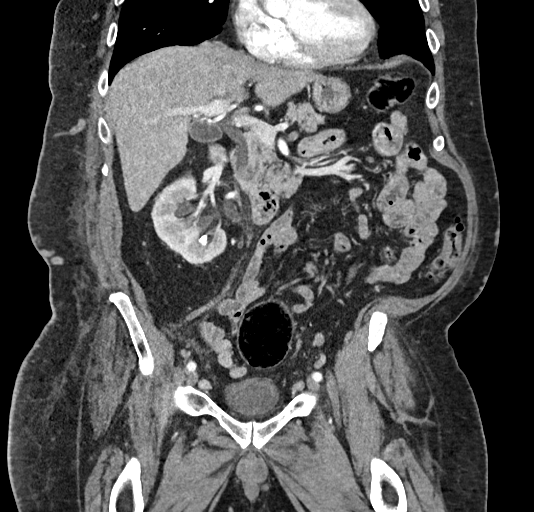
[im 134/242  soft-tissue]
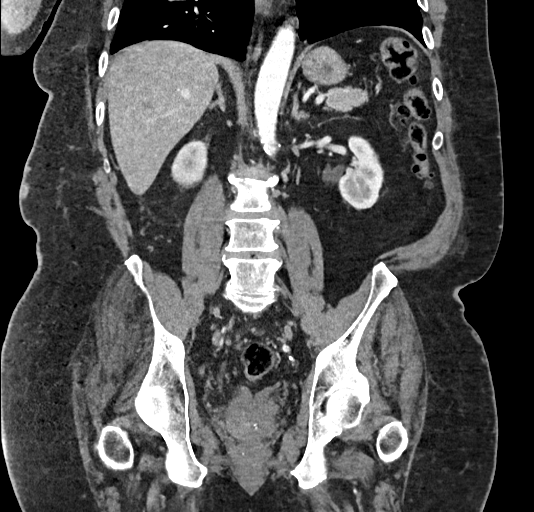

[16 of 46 positions shown; findings below may reference images not displayed]

FINDINGS: Lower chest: Clear lung bases. No significant pleural or pericardial
effusion.

Hepatobiliary: Probable ill-defined focal fat in the medial segment
of the left hepatic lobe (image [DATE]), less evident on the delayed
images. No suspicious focal lesion. Mild extrahepatic biliary
dilatation, within physiologic limits post cholecystectomy. There is
a small periampullary duodenal diverticulum.

Pancreas: Unremarkable. No pancreatic ductal dilatation or
surrounding inflammatory changes.

Spleen: Normal in size without focal abnormality.

Adrenals/Urinary Tract: Both adrenal glands appear normal. There are
3 right-sided renal calculi, largest in the lower pole, measuring up
to 15 mm on coronal image 106/6. There is mild right-sided
hydronephrosis and hydroureter with delayed contrast excretion,
secondary to an obstructing calculus at the right ureterovesical
junction. This measures 5 mm on coronal image 127/6 and is not
clearly visible on the scout image. There is some periureteral soft
tissue stranding in the false pelvis without focal fluid collection.
The left kidney appears normal. Mild generalized bladder wall
thickening.

Stomach/Bowel: No evidence of bowel wall thickening, distention or
surrounding inflammatory change. The cecum is located in the mid
abdomen. The appendix appears normal.

Vascular/Lymphatic: There are few scattered prominent
retroperitoneal and right pelvic lymph nodes, including a 9 mm
aortocaval node on image 33/2 and a 12 mm right common iliac node on
image 53/2. There is mild aortic and branch vessel atherosclerosis.
Patient has an IVC filter. There is a left scrotal varicocele.

Reproductive: The prostate gland and seminal vesicles appear normal.
As above, left scrotal varicocele.

Other: Small left inguinal hernia associated with the varicocele.

Musculoskeletal: No acute osseous findings. There is multilevel
lumbar spondylosis with probable auto fusion across the L5-S1 disc
space.
IMPRESSION: 1. Obstructing 5 mm calculus at the right ureterovesical junction.
Nonobstructing right renal calculi.
2. Periureteral soft tissue stranding and mild bladder wall
thickening. No focal fluid collection.
3. Probable focal fat in the left hepatic lobe.
4. Indeterminate mildly enlarged retroperitoneal and right pelvic
lymph nodes, probably reactive.
5. Left scrotal varicocele.  Aortic Atherosclerosis (4ATV3-G4O.O).

## 2021-02-27 DIAGNOSIS — I10 Essential (primary) hypertension: Secondary | ICD-10-CM | POA: Diagnosis not present

## 2021-02-27 DIAGNOSIS — E1142 Type 2 diabetes mellitus with diabetic polyneuropathy: Secondary | ICD-10-CM | POA: Diagnosis not present

## 2021-02-27 DIAGNOSIS — N400 Enlarged prostate without lower urinary tract symptoms: Secondary | ICD-10-CM | POA: Diagnosis not present

## 2021-02-27 DIAGNOSIS — I69351 Hemiplegia and hemiparesis following cerebral infarction affecting right dominant side: Secondary | ICD-10-CM | POA: Diagnosis not present

## 2021-02-27 DIAGNOSIS — N390 Urinary tract infection, site not specified: Secondary | ICD-10-CM | POA: Diagnosis not present

## 2021-02-27 DIAGNOSIS — I890 Lymphedema, not elsewhere classified: Secondary | ICD-10-CM | POA: Diagnosis not present

## 2021-02-27 DIAGNOSIS — G4700 Insomnia, unspecified: Secondary | ICD-10-CM | POA: Diagnosis not present

## 2021-02-27 DIAGNOSIS — J310 Chronic rhinitis: Secondary | ICD-10-CM | POA: Diagnosis not present
